# Patient Record
Sex: Female | Born: 1965
Health system: Southern US, Community
[De-identification: ages and names within clinical notes are randomized; demographics above are authoritative.]

## PROBLEM LIST (undated history)

## (undated) DIAGNOSIS — J45909 Unspecified asthma, uncomplicated: Secondary | ICD-10-CM

## (undated) DIAGNOSIS — I10 Essential (primary) hypertension: Secondary | ICD-10-CM

## (undated) DIAGNOSIS — E785 Hyperlipidemia, unspecified: Secondary | ICD-10-CM

## (undated) HISTORY — DX: Hyperlipidemia, unspecified: E78.5

---

## 2001-12-06 ENCOUNTER — Other Ambulatory Visit: Admission: RE | Admit: 2001-12-06 | Discharge: 2001-12-06 | Payer: Self-pay | Admitting: Family Medicine

## 2009-07-25 ENCOUNTER — Ambulatory Visit: Payer: Self-pay | Admitting: Family Medicine

## 2010-09-17 ENCOUNTER — Ambulatory Visit: Payer: Self-pay | Admitting: Family Medicine

## 2012-07-14 ENCOUNTER — Ambulatory Visit: Payer: Self-pay

## 2013-04-08 IMAGING — MG MM CAD DIAGNOSTIC MAMMO
1 series · 8 of 8 positions shown · non-contrast
Comparison: none

REASON FOR EXAM: LT BRST LUMP 6 OCLOCK AND YRLY
COMMENTS:

[R CC · right · 8 of 9 slices shown]
[im 1/9]
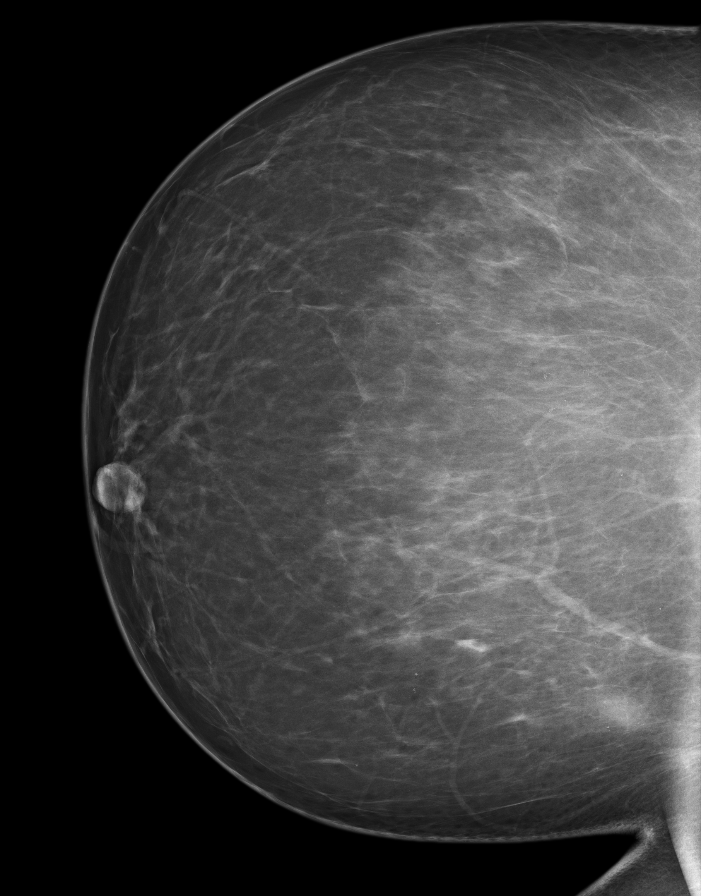
[im 2/9]
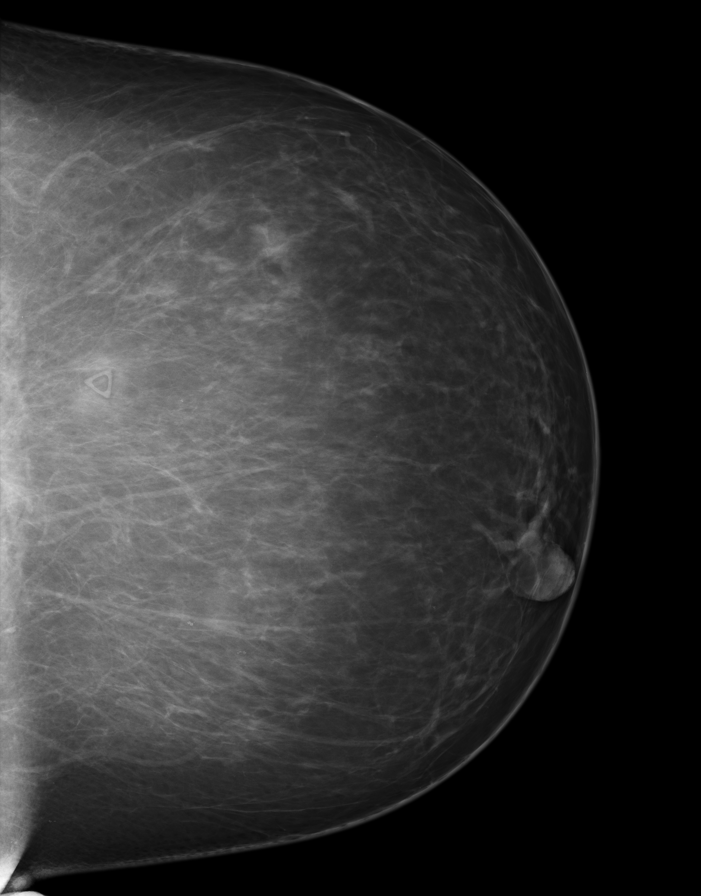
[im 3/9]
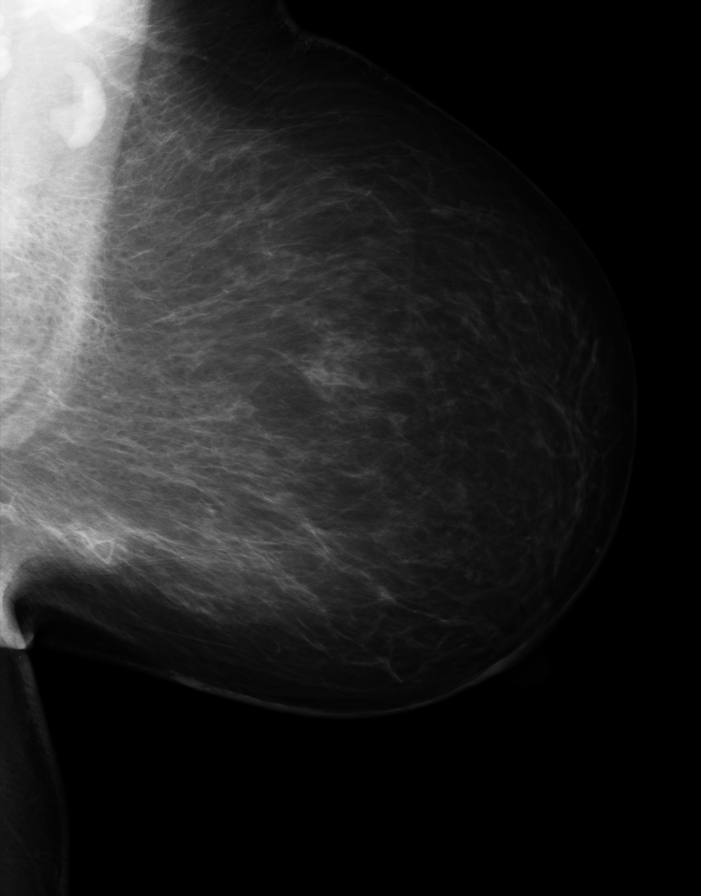
[im 4/9]
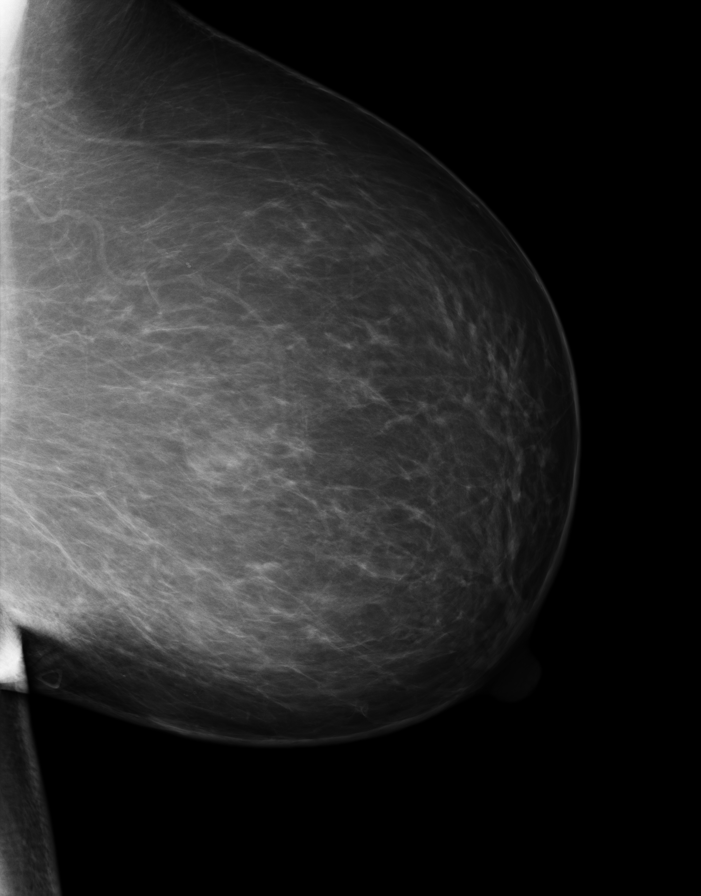
[im 5/9]
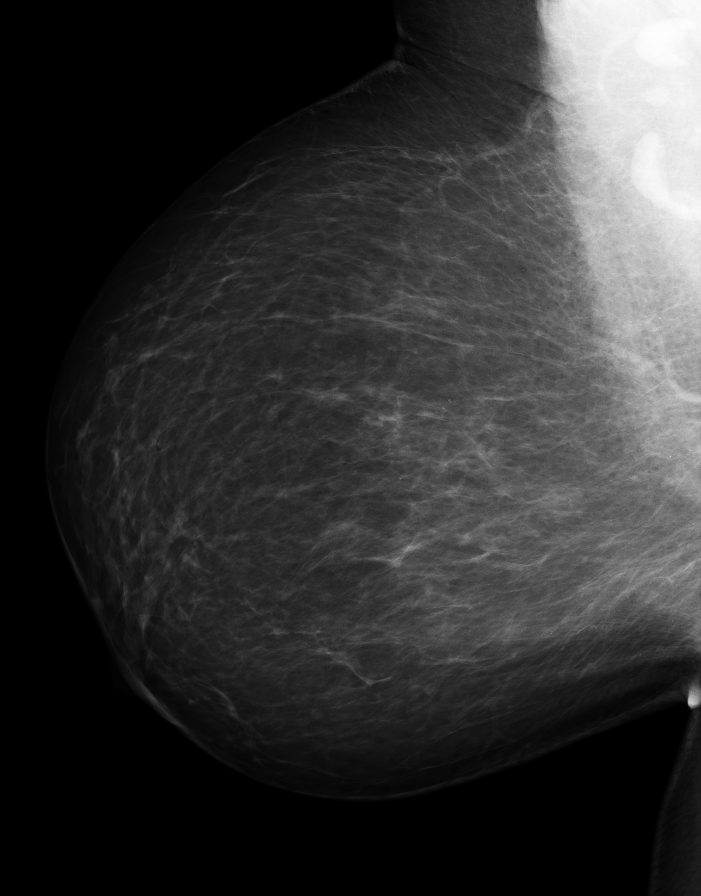
[im 6/9]
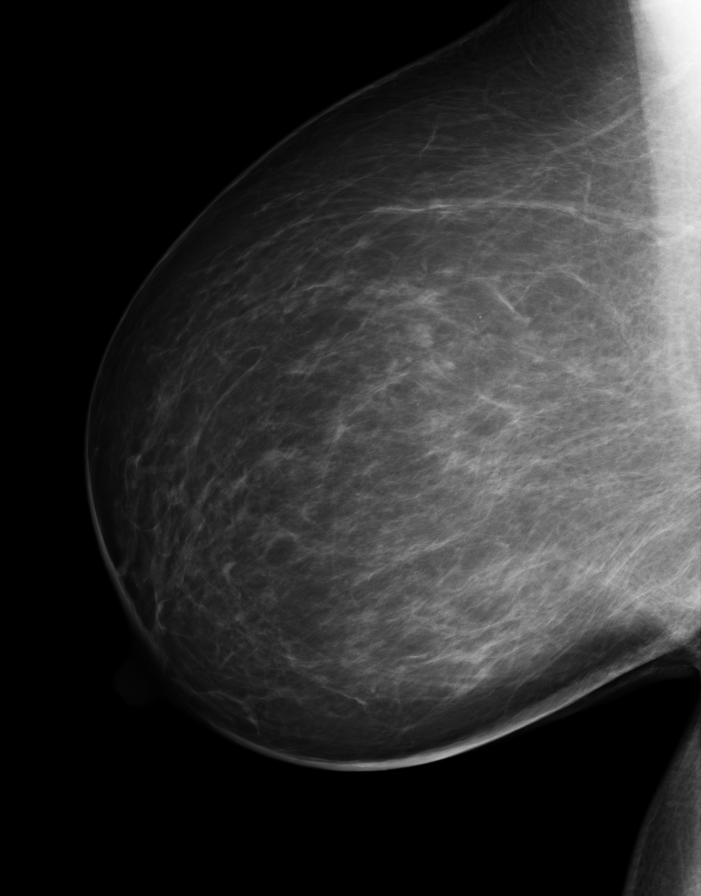
[im 7/9]
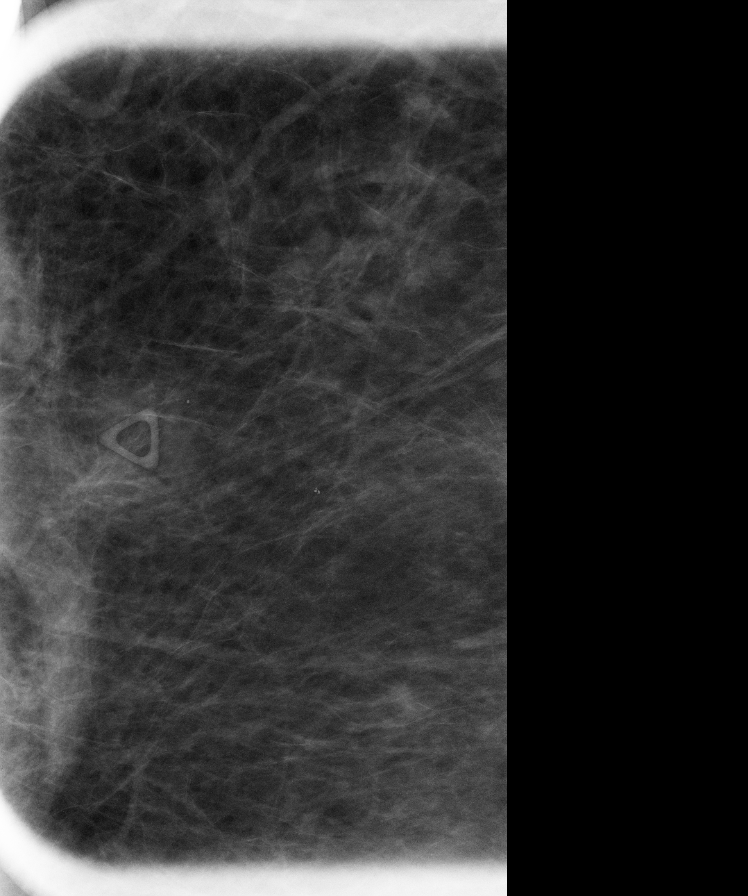
[im 9/9]
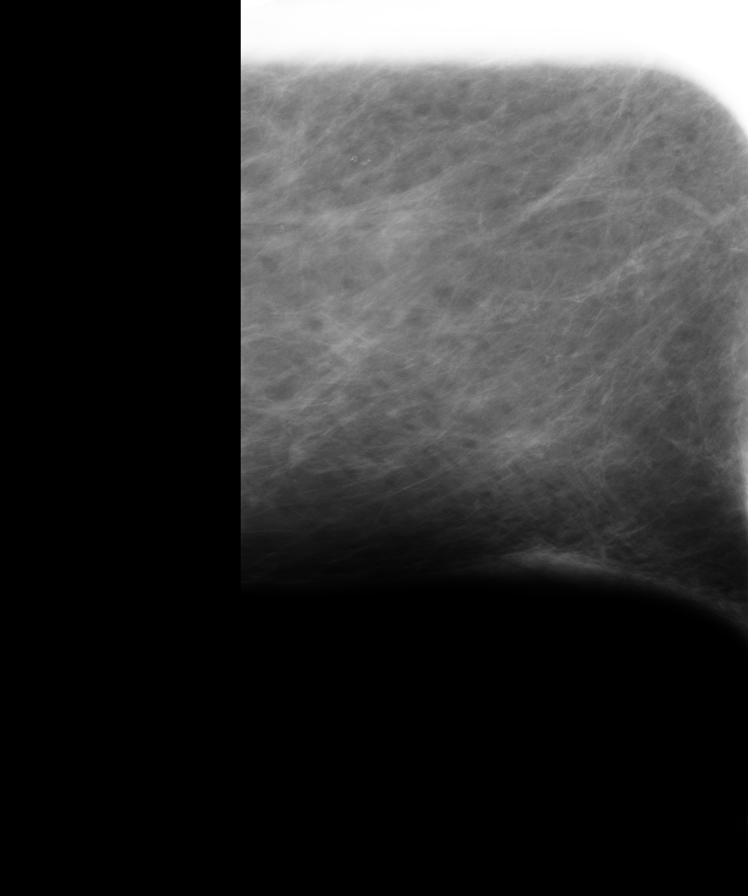

[8 of 8 positions shown; findings below may reference images not displayed]

PROCEDURE:     MAM - MAM DGTL DIAGNOSTIC MAMMO W/CAD  - July 14, 2012 [DATE]

RESULT:     The patient reports an area of palpable nodularity for the
preceding 2 weeks in the infero- lateral aspect of the left breast.
Comparison is made to a previous study September 17, 2010.

The breasts exhibit a scattered moderately dense parenchymal pattern similar
to that seen previously. There are benign-appearing lymph nodes in the
axillary regions. There is an area of increased density noted on the cc view
of the right breast medially that is not clearly evident on the MLO view. On
the left an area marked with a radiodense marker lies infero- laterally and
corresponds to the palpable finding. There are benign-appearing lymph nodes
in the axillary regions.

Spot compression views of the area of interest on the left reveals no cyst
discrete mass. Ultrasound performed today revealed an ovoid hypoechoic
nonshadowing structure in the immediate subcutaneous tissues measuring
approximately 2 cm in length by a 0.5 cm in thickness x 1.2 cm transversely.

True lateral views of both breasts do not reveal suspicious findings.
IMPRESSION: On the left in the area palpable abnormality there is a a
benign-appearing ovoid hypoechoic mass at ultrasound corresponding to the
density mammographically. The right breast does not reveal suspicious
findings.

BI-RADS 4a: There is an indeterminate hypoechoic mass at approximately the
[DATE] position of the left breast corresponding to the palpable finding. This
is likely benign but surgical consultation with an eye toward close clinical
followup or percutaneous biopsy is recommended. Continued annual
mammographic followup of the right breast is recommended.

A NEGATIVE MAMMOGRAM REPORT DOES NOT PRECLUDE BIOPSY OR OTHER EVALUATION OF
A CLINICALLY PALPABLE OR OTHERWISE SUSPICIOUS MASS OR LESION. BREAST CANCER
MAY NOT BE DETECTED BY MAMMOGRAPHY IN UP TO 10% OF CASES.

Dictation site:1

## 2013-06-29 ENCOUNTER — Other Ambulatory Visit: Payer: Self-pay | Admitting: Obstetrics and Gynecology

## 2013-06-29 ENCOUNTER — Encounter (HOSPITAL_COMMUNITY): Payer: Self-pay

## 2013-06-29 ENCOUNTER — Encounter (HOSPITAL_COMMUNITY)
Admission: RE | Admit: 2013-06-29 | Discharge: 2013-06-29 | Disposition: A | Discharge status: Home / Self Care | Payer: 59 | Source: Ambulatory Visit | Attending: Obstetrics and Gynecology | Admitting: Obstetrics and Gynecology

## 2013-06-29 DIAGNOSIS — Z01810 Encounter for preprocedural cardiovascular examination: Secondary | ICD-10-CM | POA: Insufficient documentation

## 2013-06-29 DIAGNOSIS — Z01812 Encounter for preprocedural laboratory examination: Secondary | ICD-10-CM | POA: Insufficient documentation

## 2013-06-29 DIAGNOSIS — Z01818 Encounter for other preprocedural examination: Secondary | ICD-10-CM | POA: Insufficient documentation

## 2013-06-29 HISTORY — DX: Unspecified asthma, uncomplicated: J45.909

## 2013-06-29 HISTORY — DX: Essential (primary) hypertension: I10

## 2013-06-29 LAB — COMPREHENSIVE METABOLIC PANEL
Alkaline Phosphatase: 70 U/L (ref 39–117)
BUN: 18 mg/dL (ref 6–23)
CO2: 31 mEq/L (ref 19–32)
Chloride: 95 mEq/L — ABNORMAL LOW (ref 96–112)
GFR calc Af Amer: 73 mL/min — ABNORMAL LOW (ref >90)
GFR calc non Af Amer: 63 mL/min — ABNORMAL LOW (ref >90)
Glucose, Bld: 93 mg/dL (ref 70–99)
Potassium: 4.3 mEq/L (ref 3.5–5.1)
Total Bilirubin: 0.2 mg/dL — ABNORMAL LOW (ref 0.3–1.2)
Total Protein: 6.9 g/dL (ref 6.0–8.3)

## 2013-06-29 LAB — CBC
HCT: 41.8 % (ref 36.0–46.0)
Hemoglobin: 13.9 g/dL (ref 12.0–15.0)
MCHC: 33.3 g/dL (ref 30.0–36.0)

## 2013-06-29 NOTE — Patient Instructions (Signed)
20 Colleen Gibbs  06/29/2013   Your procedure is scheduled on:  July 14, 2013   Enter through the Main Entrance of [facility] at 0900 AM.  Pick up the phone at the desk and dial [phone].   Call this number if you have problems the morning of surgery: [phone]   Remember:   Do not eat food:After Midnight.  Do not drink clear liquids: After Midnight.  Take these medicines the morning of surgery with A SIP OF WATER: Lisinopril and Metoprolol, bring inhaler day of surgery as well.   Do not wear jewelry, make-up or nail polish.  Do not wear lotions, powders, or perfumes. You may wear deodorant.  Do not shave 48 hours prior to surgery.  Do not bring valuables to the hospital.  [facility] is not   responsible for any belongings or valuables brought to the hospital.  Contacts, dentures or bridgework may not be worn into surgery.  Leave suitcase in the car. After surgery it may be brought to your room.  For patients admitted to the hospital, checkout time is 11:00 AM the day of              discharge.   Patients discharged the day of surgery will not be allowed to drive             home.  Name and phone number of your driver: Synetta Fail [phone]  Please read over the following fact sheets that you were given:   Preparing for surgery

## 2013-07-14 ENCOUNTER — Encounter (HOSPITAL_COMMUNITY): Payer: Self-pay | Admitting: Registered Nurse

## 2013-07-14 ENCOUNTER — Ambulatory Visit (HOSPITAL_COMMUNITY): Payer: 59 | Admitting: Registered Nurse

## 2013-07-14 ENCOUNTER — Ambulatory Visit (HOSPITAL_COMMUNITY)
Admission: RE | Admit: 2013-07-14 | Discharge: 2013-07-14 | Disposition: A | Discharge status: Home / Self Care | Payer: 59 | Source: Ambulatory Visit | Attending: Obstetrics and Gynecology | Admitting: Obstetrics and Gynecology

## 2013-07-14 ENCOUNTER — Encounter (HOSPITAL_COMMUNITY)
Admission: RE | Disposition: A | Discharge status: Home / Self Care | Payer: Self-pay | Source: Ambulatory Visit | Attending: Obstetrics and Gynecology

## 2013-07-14 DIAGNOSIS — Z302 Encounter for sterilization: Secondary | ICD-10-CM | POA: Insufficient documentation

## 2013-07-14 HISTORY — PX: LAPAROSCOPIC TUBAL LIGATION: SHX1937

## 2013-07-14 LAB — PREGNANCY, URINE: Preg Test, Ur: NEGATIVE

## 2013-07-14 SURGERY — LIGATION, FALLOPIAN TUBE, LAPAROSCOPIC
Anesthesia: General | Site: Abdomen | Laterality: Bilateral | Wound class: Clean

## 2013-07-14 MED ORDER — ONDANSETRON HCL 4 MG/2ML IJ SOLN: 4.0000 mg | Freq: Once | INTRAMUSCULAR | Status: DC | PRN

## 2013-07-14 MED ORDER — LIDOCAINE HCL (CARDIAC) 20 MG/ML IV SOLN
INTRAVENOUS | Status: AC
  Filled 2013-07-14: qty 5

## 2013-07-14 MED ORDER — ROCURONIUM BROMIDE 50 MG/5ML IV SOLN
INTRAVENOUS | Status: AC
  Filled 2013-07-14: qty 1

## 2013-07-14 MED ORDER — LIDOCAINE HCL (CARDIAC) 20 MG/ML IV SOLN
INTRAVENOUS | Status: DC | PRN
  Administered 2013-07-14: 100 mg via INTRAVENOUS

## 2013-07-14 MED ORDER — IBUPROFEN 800 MG PO TABS: 800.0000 mg | ORAL_TABLET | Freq: Three times a day (TID) | ORAL | Status: DC | PRN

## 2013-07-14 MED ORDER — ONDANSETRON HCL 4 MG/2ML IJ SOLN
INTRAMUSCULAR | Status: AC
  Filled 2013-07-14: qty 2

## 2013-07-14 MED ORDER — GLYCOPYRROLATE 0.2 MG/ML IJ SOLN
INTRAMUSCULAR | Status: AC
  Filled 2013-07-14: qty 3

## 2013-07-14 MED ORDER — NEOSTIGMINE METHYLSULFATE 1 MG/ML IJ SOLN
INTRAMUSCULAR | Status: DC | PRN
  Administered 2013-07-14: 4 mg via INTRAVENOUS

## 2013-07-14 MED ORDER — SODIUM CHLORIDE 0.9 % IJ SOLN
INTRAMUSCULAR | Status: DC | PRN
  Administered 2013-07-14: 10 mL

## 2013-07-14 MED ORDER — FENTANYL CITRATE 0.05 MG/ML IJ SOLN
INTRAMUSCULAR | Status: AC
  Filled 2013-07-14: qty 5

## 2013-07-14 MED ORDER — MIDAZOLAM HCL 2 MG/2ML IJ SOLN
INTRAMUSCULAR | Status: AC
  Filled 2013-07-14: qty 2

## 2013-07-14 MED ORDER — DEXAMETHASONE SODIUM PHOSPHATE 10 MG/ML IJ SOLN
INTRAMUSCULAR | Status: AC
  Filled 2013-07-14: qty 1

## 2013-07-14 MED ORDER — PROPOFOL 10 MG/ML IV EMUL
INTRAVENOUS | Status: AC
  Filled 2013-07-14: qty 20

## 2013-07-14 MED ORDER — PROPOFOL 10 MG/ML IV BOLUS
INTRAVENOUS | Status: DC | PRN
  Administered 2013-07-14: 200 mg via INTRAVENOUS

## 2013-07-14 MED ORDER — EPHEDRINE SULFATE 50 MG/ML IJ SOLN
INTRAMUSCULAR | Status: DC | PRN
  Administered 2013-07-14 (×2): 15 mg via INTRAVENOUS

## 2013-07-14 MED ORDER — ONDANSETRON HCL 4 MG/2ML IJ SOLN
INTRAMUSCULAR | Status: DC | PRN
  Administered 2013-07-14: 4 mg via INTRAVENOUS

## 2013-07-14 MED ORDER — MEPERIDINE HCL 25 MG/ML IJ SOLN: 6.2500 mg | INTRAMUSCULAR | Status: DC | PRN

## 2013-07-14 MED ORDER — ROCURONIUM BROMIDE 100 MG/10ML IV SOLN
INTRAVENOUS | Status: DC | PRN
  Administered 2013-07-14: 10 mg via INTRAVENOUS
  Administered 2013-07-14: 40 mg via INTRAVENOUS

## 2013-07-14 MED ORDER — MIDAZOLAM HCL 5 MG/5ML IJ SOLN
INTRAMUSCULAR | Status: DC | PRN
  Administered 2013-07-14: 2 mg via INTRAVENOUS

## 2013-07-14 MED ORDER — GLYCOPYRROLATE 0.2 MG/ML IJ SOLN
INTRAMUSCULAR | Status: DC | PRN
  Administered 2013-07-14: 0.6 mg via INTRAVENOUS

## 2013-07-14 MED ORDER — LACTATED RINGERS IV SOLN
INTRAVENOUS | Status: DC
  Administered 2013-07-14 (×2): via INTRAVENOUS

## 2013-07-14 MED ORDER — SUCCINYLCHOLINE CHLORIDE 20 MG/ML IJ SOLN
INTRAMUSCULAR | Status: DC | PRN
  Administered 2013-07-14: 120 mg via INTRAVENOUS

## 2013-07-14 MED ORDER — NEOSTIGMINE METHYLSULFATE 1 MG/ML IJ SOLN
INTRAMUSCULAR | Status: AC
  Filled 2013-07-14: qty 1

## 2013-07-14 MED ORDER — FENTANYL CITRATE 0.05 MG/ML IJ SOLN: 25.0000 ug | INTRAMUSCULAR | Status: DC | PRN

## 2013-07-14 MED ORDER — EPHEDRINE 5 MG/ML INJ
INTRAVENOUS | Status: AC
  Filled 2013-07-14: qty 10

## 2013-07-14 MED ORDER — KETOROLAC TROMETHAMINE 30 MG/ML IJ SOLN: 15.0000 mg | Freq: Once | INTRAMUSCULAR | Status: DC | PRN

## 2013-07-14 MED ORDER — BUPIVACAINE HCL (PF) 0.25 % IJ SOLN
INTRAMUSCULAR | Status: DC | PRN
  Administered 2013-07-14: 7 mL

## 2013-07-14 MED ORDER — DEXAMETHASONE SODIUM PHOSPHATE 10 MG/ML IJ SOLN
INTRAMUSCULAR | Status: DC | PRN
  Administered 2013-07-14: 10 mg via INTRAVENOUS

## 2013-07-14 MED ORDER — FENTANYL CITRATE 0.05 MG/ML IJ SOLN
INTRAMUSCULAR | Status: DC | PRN
  Administered 2013-07-14: 150 ug via INTRAVENOUS

## 2013-07-14 MED ORDER — BUPIVACAINE HCL (PF) 0.25 % IJ SOLN
INTRAMUSCULAR | Status: AC
  Filled 2013-07-14: qty 30

## 2013-07-14 MED ORDER — SUCCINYLCHOLINE CHLORIDE 20 MG/ML IJ SOLN
INTRAMUSCULAR | Status: AC
  Filled 2013-07-14: qty 10

## 2013-07-14 SURGICAL SUPPLY — 25 items
CATH ROBINSON RED A/P 16FR (CATHETERS) ×2 IMPLANT
CLOTH BEACON ORANGE TIMEOUT ST (SAFETY) ×2 IMPLANT
DERMABOND ADVANCED (GAUZE/BANDAGES/DRESSINGS) ×1
DERMABOND ADVANCED .7 DNX12 (GAUZE/BANDAGES/DRESSINGS) ×1 IMPLANT
DILATOR CANAL MILEX (MISCELLANEOUS) ×2 IMPLANT
FORCEPS CUTTING 33CM 5MM (CUTTING FORCEPS) ×2 IMPLANT
FORCEPS CUTTING 45CM 5MM (CUTTING FORCEPS) ×2 IMPLANT
GLOVE BIO SURGEON STRL SZ 6.5 (GLOVE) ×4 IMPLANT
GLOVE BIOGEL PI IND STRL 7.0 (GLOVE) ×3 IMPLANT
GLOVE BIOGEL PI INDICATOR 7.0 (GLOVE) ×3
GOWN PREVENTION PLUS LG XLONG (DISPOSABLE) ×4 IMPLANT
NEEDLE INSUFFLATION 120MM (ENDOMECHANICALS) ×2 IMPLANT
NEEDLE INSUFFLATION 150MM (ENDOMECHANICALS) ×2 IMPLANT
PACK LAPAROSCOPY BASIN (CUSTOM PROCEDURE TRAY) ×2 IMPLANT
SUT VICRYL 0 UR6 27IN ABS (SUTURE) ×2 IMPLANT
SUT VICRYL 4-0 PS2 18IN ABS (SUTURE) ×2 IMPLANT
SUT VICRYL RAPIDE 4/0 PS 2 (SUTURE) ×2 IMPLANT
TOWEL OR 17X24 6PK STRL BLUE (TOWEL DISPOSABLE) ×4 IMPLANT
TROCAR 12M 150ML BLUNT (TROCAR) ×2 IMPLANT
TROCAR OPTI TIP 5M 100M (ENDOMECHANICALS) ×2 IMPLANT
TROCAR XCEL DIL TIP R 11M (ENDOMECHANICALS) ×2 IMPLANT
TROCAR XCEL NON-BLD 5MMX100MML (ENDOMECHANICALS) ×2 IMPLANT
TROCAR XCEL OPT SLVE 5M 100M (ENDOMECHANICALS) ×2 IMPLANT
TUBING INSUFFLATION W/FILTER (TUBING) ×2 IMPLANT
WATER STERILE IRR 1000ML POUR (IV SOLUTION) ×2 IMPLANT

## 2013-07-14 NOTE — Brief Op Note (Signed)
07/14/2013  12:18 PM  PATIENT:  Colleen Gibbs  47 y.o. female  PRE-OPERATIVE DIAGNOSIS:  Desires Sterilization    POST-OPERATIVE DIAGNOSIS:  Desires Sterilization , morbid obesity  PROCEDURE:  Laparoscopic bilateral salpingectomy  SURGEON:  Surgeon(s) and Role:    * Machi Whittaker A Katira Dumais, MD - Primary  PHYSICIAN ASSISTANT:   ASSISTANTS: none   ANESTHESIA:   general Findings: nl ovaries, nl tubes, uterus w/ small fibroid EBL:  Total I/O In: 1000 [I.V.:1000] Out: 20 [Urine:20]  BLOOD ADMINISTERED:none  DRAINS: none   LOCAL MEDICATIONS USED:  MARCAINE     SPECIMEN:  Source of Specimen:  bilateral tubes  DISPOSITION OF SPECIMEN:  PATHOLOGY  COUNTS:  YES  TOURNIQUET:  * No tourniquets in log *  DICTATION: .Other Dictation: Dictation Number [id number]  PLAN OF CARE: Discharge to home after PACU  PATIENT DISPOSITION:  PACU - hemodynamically stable.   Delay start of Pharmacological VTE agent (>24hrs) due to surgical blood loss or risk of bleeding: no

## 2013-07-14 NOTE — Anesthesia Postprocedure Evaluation (Signed)
  Anesthesia Post-op Note  Patient: Colleen Gibbs  Procedure(s) Performed: Procedure(s): LAPAROSCOPIC TUBAL LIGATION (Bilateral)  Patient Location: PACU  Anesthesia Type:General  Level of Consciousness: awake, alert  and oriented  Airway and Oxygen Therapy: Patient Spontanous Breathing  Post-op Pain: none  Post-op Assessment: Post-op Vital signs reviewed, Patient's Cardiovascular Status Stable, Respiratory Function Stable, Patent Airway, No signs of Nausea or vomiting and Pain level controlled  Post-op Vital Signs: Reviewed and stable  Complications: No apparent anesthesia complications

## 2013-07-14 NOTE — Transfer of Care (Signed)
Immediate Anesthesia Transfer of Care Note  Patient: Colleen Gibbs  Procedure(s) Performed: Procedure(s): LAPAROSCOPIC TUBAL LIGATION (Bilateral)  Patient Location: PACU  Anesthesia Type:General  Level of Consciousness: awake, alert , oriented and patient cooperative  Airway & Oxygen Therapy: Patient Spontanous Breathing and Patient connected to nasal cannula oxygen  Post-op Assessment: Report given to PACU RN, Post -op Vital signs reviewed and stable and Patient moving all extremities X 4  Post vital signs: Reviewed and stable  Complications: No apparent anesthesia complications

## 2013-07-14 NOTE — Preoperative (Signed)
Beta Blockers   Reason not to administer Beta Blockers:Not Applicable 

## 2013-07-14 NOTE — Anesthesia Preprocedure Evaluation (Signed)
Anesthesia Evaluation  Patient identified by MRN, date of birth, ID band Patient awake    Reviewed: Allergy & Precautions, H&P , NPO status , Patient's Chart, lab work & pertinent test results, reviewed documented beta blocker date and time   Airway Mallampati: I TM Distance: >3 FB Neck ROM: full    Dental no notable dental hx. (+) Teeth Intact   Pulmonary  Will use her inhaler prior to OR   Pulmonary exam normal       Cardiovascular hypertension, Pt. on medications and Pt. on home beta blockers negative cardio ROS      Neuro/Psych negative neurological ROS  negative psych ROS   GI/Hepatic negative GI ROS, Neg liver ROS,   Endo/Other  negative endocrine ROSMorbid obesity  Renal/GU negative Renal ROS  negative genitourinary   Musculoskeletal negative musculoskeletal ROS (+)   Abdominal (+) + obese,   Peds  Hematology negative hematology ROS (+)   Anesthesia Other Findings   Reproductive/Obstetrics negative OB ROS                           Anesthesia Physical Anesthesia Plan  ASA: III  Anesthesia Plan: General   Post-op Pain Management:    Induction: Intravenous  Airway Management Planned: Oral ETT  Additional Equipment:   Intra-op Plan:   Post-operative Plan: Extubation in OR  Informed Consent: I have reviewed the patients History and Physical, chart, labs and discussed the procedure including the risks, benefits and alternatives for the proposed anesthesia with the patient or authorized representative who has indicated his/her understanding and acceptance.   Dental Advisory Given  Plan Discussed with: CRNA and Surgeon  Anesthesia Plan Comments:         Anesthesia Quick Evaluation

## 2013-07-15 ENCOUNTER — Encounter (HOSPITAL_COMMUNITY): Payer: Self-pay | Admitting: Obstetrics and Gynecology

## 2013-07-15 NOTE — Op Note (Signed)
NAME:  Colleen Gibbs, Colleen Gibbs NO.:  [id number]  MEDICAL RECORD NO.:  [id number]  LOCATION:  [facility]                          FACILITY:  [facility]  PHYSICIAN:  Maxie Better, M.D.DATE OF BIRTH:  10/18/1965  DATE OF PROCEDURE:  07/14/2013 DATE OF DISCHARGE:  07/14/2013                              OPERATIVE REPORT   PREOPERATIVE DIAGNOSIS:  Desires sterilization.  PROCEDURE:  Laparoscopic bilateral salpingectomy.  POSTOPERATIVE DIAGNOSIS:  Desires sterilization, morbid obesity.  ANESTHESIA:  General.  SURGEON:  Maxie Better, MD.  ASSISTANT:  None.  DESCRIPTION OF PROCEDURE:  Under adequate general anesthesia, the patient was placed in dorsal lithotomy position.  She was sterilely prepped and draped in usual fashion.  Bladder was catheterized.  Mild amount of urine.  Examination under anesthesia revealed anteverted uterus.  The exam, however, was limited by the patient's body habitus. Bivalve speculum was placed in vagina.  Single-tooth tenaculum was placed on the anterior lip of the cervix.  Os Finders was used to dilate the cervix.  An Acorn cannula was introduced into the cervical os and attached tenaculum for manipulation of the uterus.  Attention the bivalve speculum was removed.  Then attention was then turned to the abdomen.  An 0.25% Marcaine was injected in the infraumbilical area.  An infraumbilical vertical incision was made.  Veress needle was introduced and tested several times with no return on fluid.  At that point, the vesi point with a straight scope was then used to enter the abdominal Cavity under direct visualization.  Once this was done, the operative scope was inserted and the abdomen was insufflated.  In the left lower quadrant, a 5-mm port site was placed under direct visualization.  There was 1 place on the right lower quadrant, which was limited due to the patient's anterior abdominal wall fat.  Nonetheless, the port was finally placed.   Inspection notable for uterus with a small anterior fibroid.  Both ovaries appeared normal.  The liver edge appeared normal.  Both fallopian tubes were brought up into the field and was grasped the underlying mesosalpinx was then serially clamped, cauterized, and then cut using the gyrus and both tubes were then removed.  Both tubes were then removed through the umbilical port site.  Once this was done, the pelvis was inspected.  The port sites was inspected.  The lower ports were removed under direct visualization taking care not to bring up any underlying structures.  Good hemostasis was noted. The umbilical site was also removed and the incisions were then closed with 4-0 Vicryl sutures and the instruments in the vagina were removed.  SPECIMENS:  Fallopian tubes x2 sent to Pathology.  ESTIMATED BLOOD LOSS:  10 mL.  COMPLICATIONS:  None.  The patient tolerated procedure well, was transferred to recovery in stable condition.     Maxie Better, M.D.     Lafferty/MEDQ  D:  07/14/2013  T:  07/15/2013  Job:  [id number]

## 2013-11-02 ENCOUNTER — Ambulatory Visit: Payer: Self-pay | Admitting: Family Medicine

## 2014-07-28 IMAGING — CR DG HIP COMPLETE 2+V*L*
1 series · 3 of 3 positions shown · non-contrast
Comparison: None.

CLINICAL DATA: Left hip pain post fall a week ago

EXAM:
LEFT HIP - COMPLETE 2+ VIEW

[Series 2: t pelvis ap · 0.14mm/px · 3 of 3 slices shown]
[im 1/3]
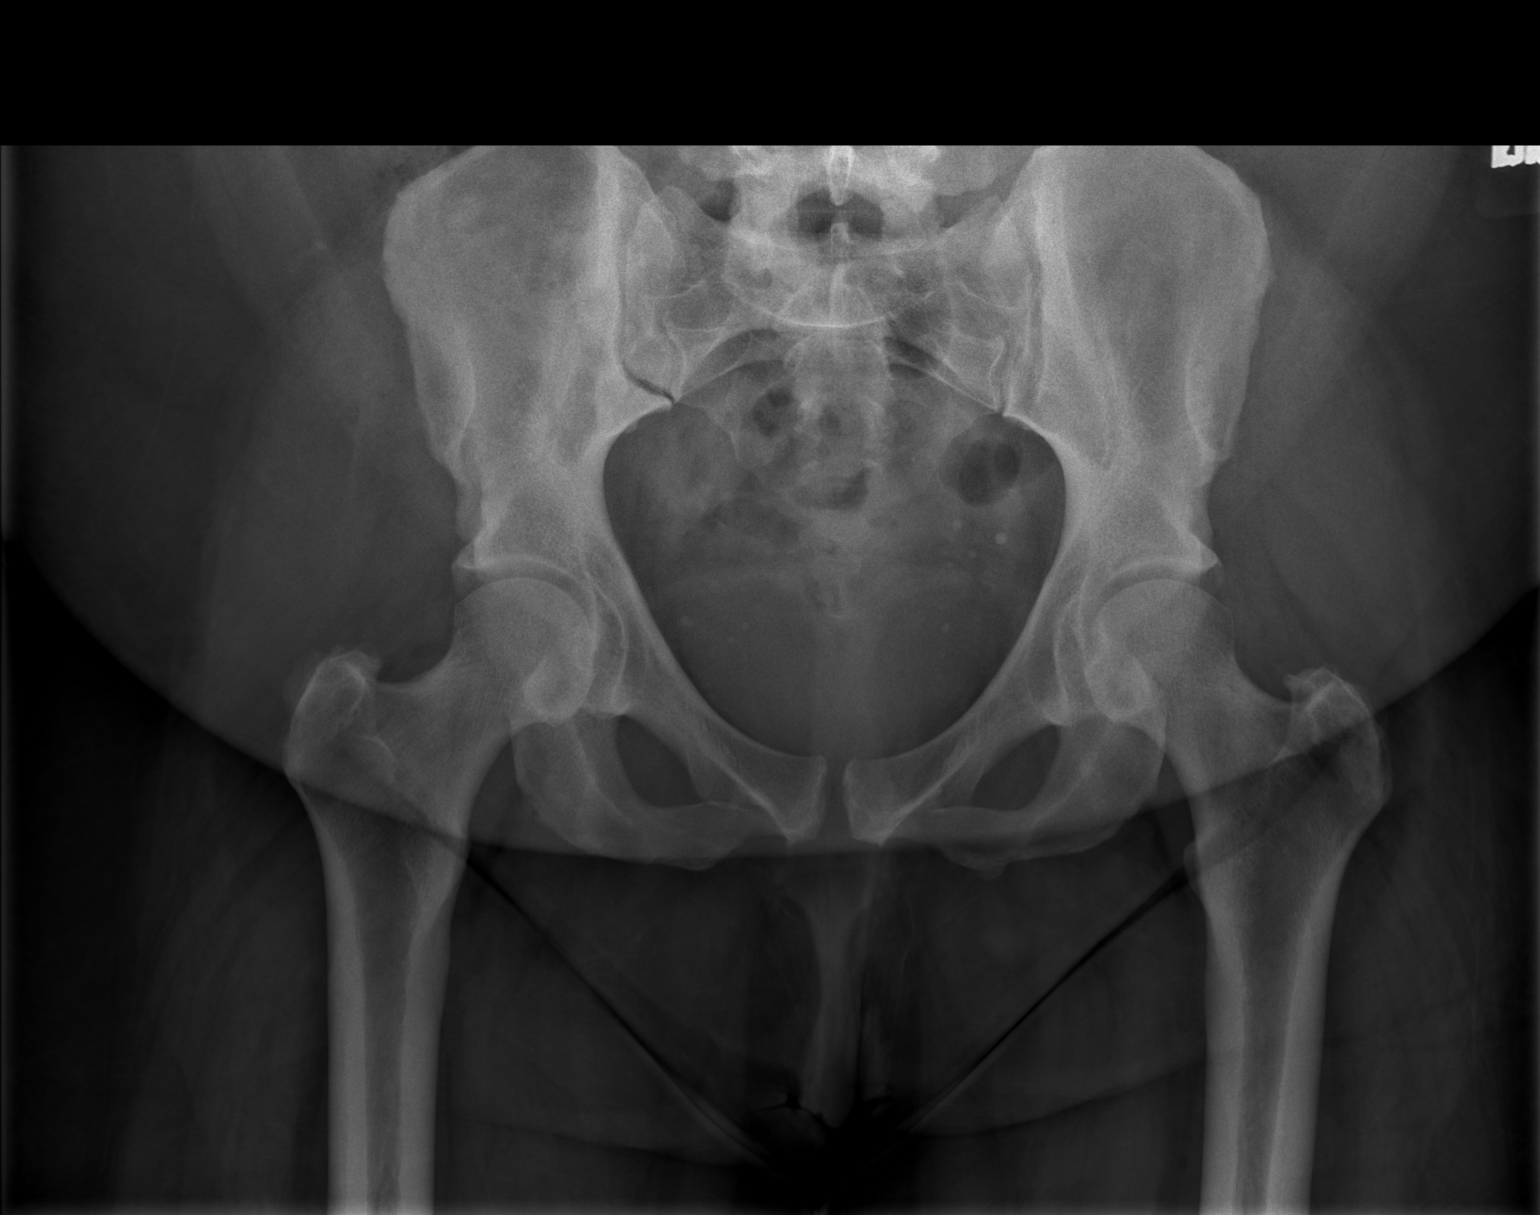
[im 2/3]
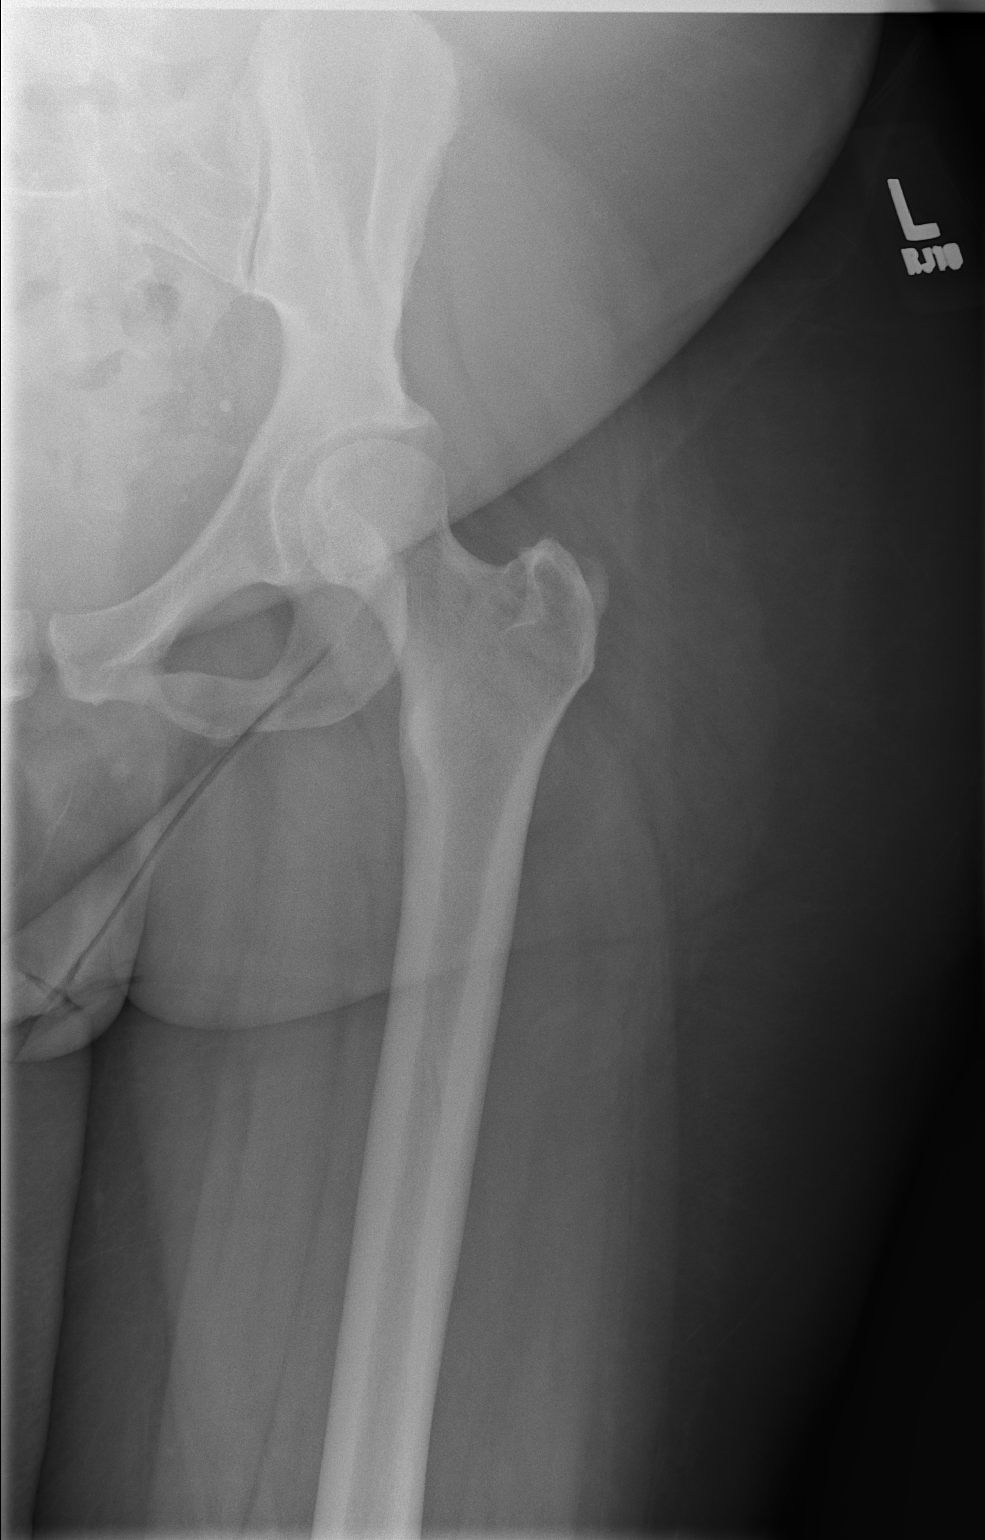
[im 3/3]
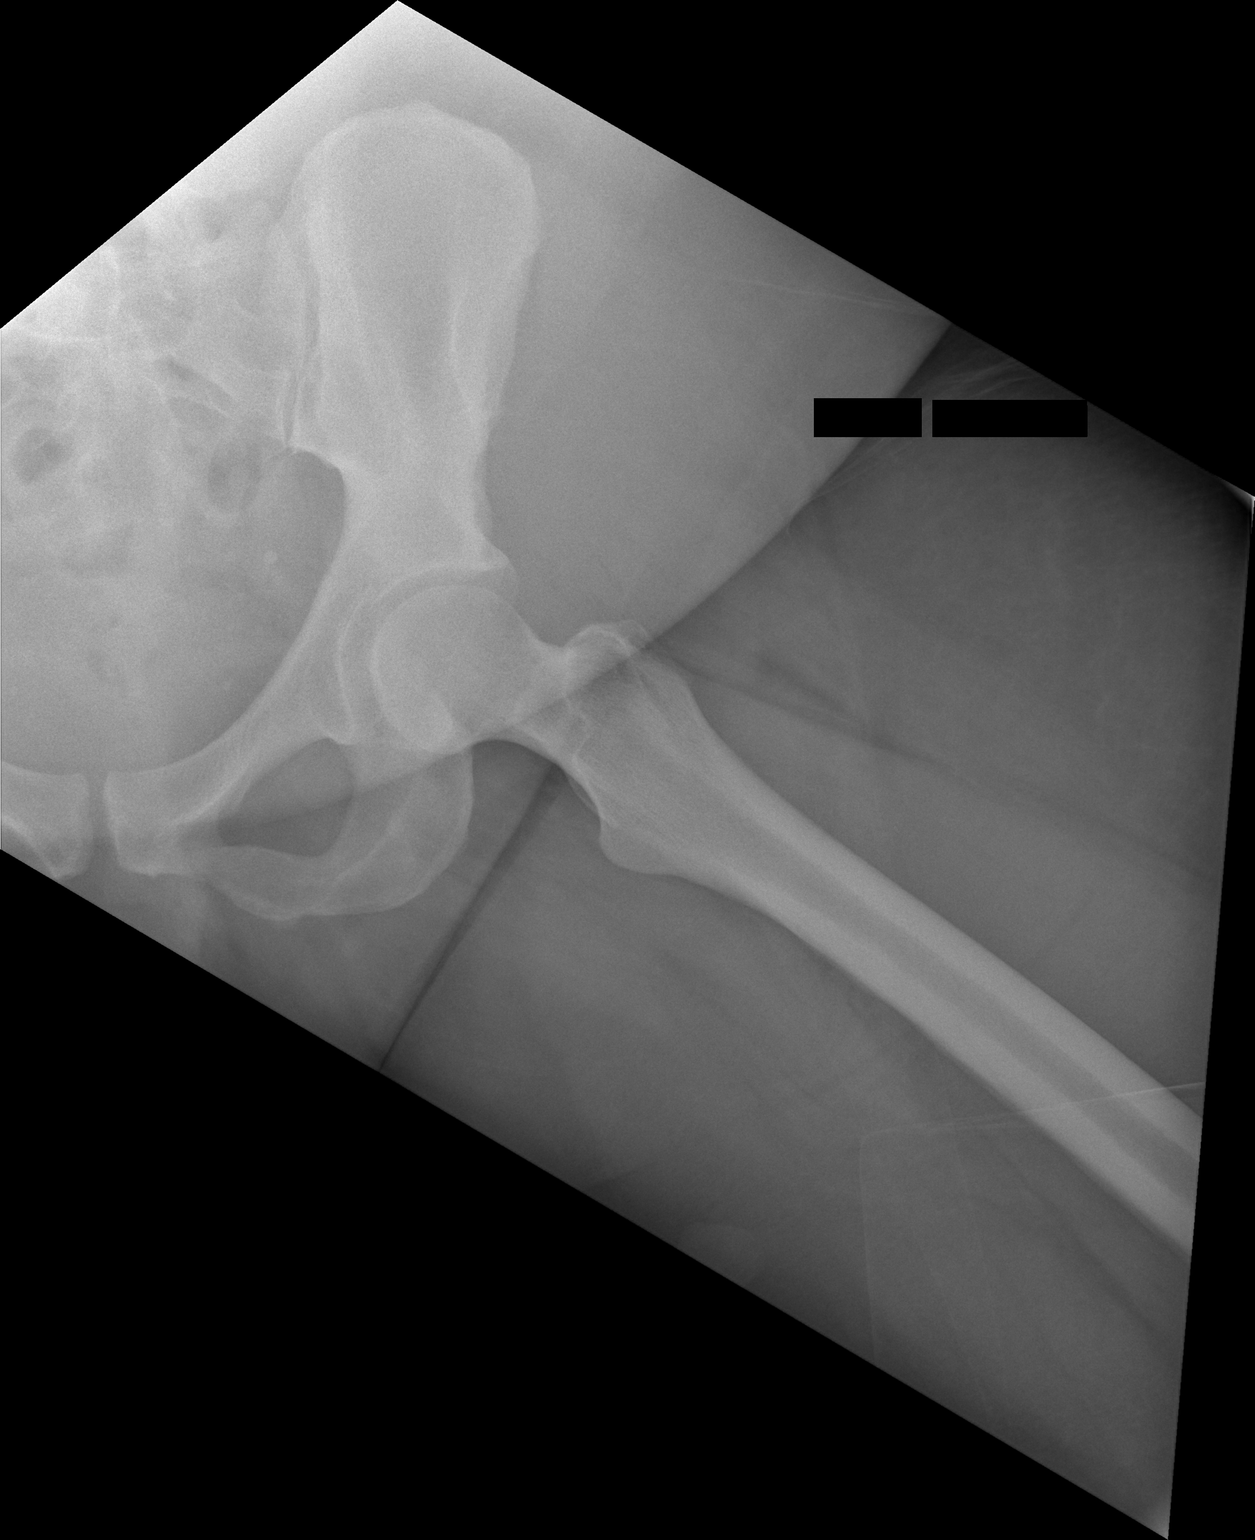

[3 of 3 positions shown; findings below may reference images not displayed]

FINDINGS: Three views of the left hip submitted. No acute fracture or
subluxation. No radiopaque foreign body.
IMPRESSION: Negative.

## 2015-04-13 ENCOUNTER — Other Ambulatory Visit
Admission: RE | Admit: 2015-04-13 | Discharge: 2015-04-13 | Disposition: A | Discharge status: Home / Self Care | Payer: 59 | Source: Ambulatory Visit | Attending: Cardiology | Admitting: Cardiology

## 2015-04-13 DIAGNOSIS — I10 Essential (primary) hypertension: Secondary | ICD-10-CM | POA: Insufficient documentation

## 2015-04-13 DIAGNOSIS — R739 Hyperglycemia, unspecified: Secondary | ICD-10-CM | POA: Diagnosis present

## 2015-04-13 DIAGNOSIS — R809 Proteinuria, unspecified: Secondary | ICD-10-CM | POA: Diagnosis present

## 2015-04-13 LAB — LIPID PANEL
CHOLESTEROL: 233 mg/dL — AB (ref 0–200)
HDL: 63 mg/dL (ref >40)
LDL Cholesterol: 143 mg/dL — ABNORMAL HIGH (ref 0–99)
TRIGLYCERIDES: 136 mg/dL (ref <150)
Total CHOL/HDL Ratio: 3.7 RATIO
VLDL: 27 mg/dL (ref 0–40)

## 2015-04-13 LAB — HEMOGLOBIN A1C: HEMOGLOBIN A1C: 5.8 % (ref 4.0–6.0)

## 2015-04-13 LAB — URINALYSIS COMPLETE WITH MICROSCOPIC (ARMC ONLY)
Bilirubin Urine: NEGATIVE
Glucose, UA: NEGATIVE mg/dL
Ketones, ur: NEGATIVE mg/dL
NITRITE: NEGATIVE
Protein, ur: 30 mg/dL — AB
SPECIFIC GRAVITY, URINE: 1.017 (ref 1.005–1.030)
pH: 5 (ref 5.0–8.0)

## 2015-04-13 LAB — COMPREHENSIVE METABOLIC PANEL
ALT: 20 U/L (ref 14–54)
AST: 20 U/L (ref 15–41)
Albumin: 4.1 g/dL (ref 3.5–5.0)
Alkaline Phosphatase: 69 U/L (ref 38–126)
Anion gap: 10 (ref 5–15)
BUN: 18 mg/dL (ref 6–20)
CALCIUM: 9.2 mg/dL (ref 8.9–10.3)
CO2: 27 mmol/L (ref 22–32)
CREATININE: 1.01 mg/dL — AB (ref 0.44–1.00)
Chloride: 101 mmol/L (ref 101–111)
GLUCOSE: 95 mg/dL (ref 65–99)
Potassium: 3.6 mmol/L (ref 3.5–5.1)
Sodium: 138 mmol/L (ref 135–145)
TOTAL PROTEIN: 8 g/dL (ref 6.5–8.1)
Total Bilirubin: 0.4 mg/dL (ref 0.3–1.2)

## 2015-04-13 LAB — CBC
HCT: 39 % (ref 35.0–47.0)
Hemoglobin: 12.7 g/dL (ref 12.0–16.0)
MCH: 28 pg (ref 26.0–34.0)
MCHC: 32.6 g/dL (ref 32.0–36.0)
MCV: 85.8 fL (ref 80.0–100.0)
Platelets: 305 10*3/uL (ref 150–440)
RBC: 4.54 MIL/uL (ref 3.80–5.20)
RDW: 17.1 % — AB (ref 11.5–14.5)
WBC: 9.4 10*3/uL (ref 3.6–11.0)

## 2015-04-14 LAB — MICROALBUMIN, URINE: Microalb, Ur: 30.3 ug/mL — ABNORMAL HIGH

## 2015-05-02 ENCOUNTER — Ambulatory Visit (INDEPENDENT_AMBULATORY_CARE_PROVIDER_SITE_OTHER): Payer: 59 | Admitting: Obstetrics and Gynecology

## 2015-05-02 ENCOUNTER — Encounter: Payer: Self-pay | Admitting: Obstetrics and Gynecology

## 2015-05-02 VITALS — BP 139/81 | HR 91 | Ht 59.0 in | Wt 263.0 lb

## 2015-05-02 DIAGNOSIS — Z1239 Encounter for other screening for malignant neoplasm of breast: Secondary | ICD-10-CM

## 2015-05-02 DIAGNOSIS — Z01419 Encounter for gynecological examination (general) (routine) without abnormal findings: Secondary | ICD-10-CM | POA: Diagnosis not present

## 2015-05-03 NOTE — Progress Notes (Signed)
Subjective:    Colleen Gibbs is a 49 y.o. female who presents for an annual exam. The patient has complaints of vaginal discharge last week, that has since resolved. The patient is sexually active in a monogamous opposite sex relationship. GYN screening history: last pap: approximate date 2012 and was normal and last mammogram: patient does not recall when last mammogram was done however notes it has been more than 3 years. The patient wears seatbelts: yes. The patient participates in regular exercise: no. Has the patient ever been transfused or tattooed?: no. The patient reports that there is not domestic violence in her life.   Menstrual History: OB History    Gravida Para Term Preterm AB TAB SAB Ectopic Multiple Living   0 0 0 0 0 0 0 0 0 0       Menarche age: 81 Patient's last menstrual period was 04/23/2015.  Reports regular menstrual cycles.  Cycles last 4-5 days, medium flow, no dysmenorrhea.   Denies h/o STIs.  Reports remote h/o 1 abnormal pap smear in the past, did not require treatment.    Past Medical History  Diagnosis Date  . Hypertension   . Asthma     just uses inhaler when needed  . Hyperlipemia     Past Surgical History  Procedure Laterality Date  . Laparoscopic tubal ligation Bilateral 07/14/2013    Procedure: LAPAROSCOPIC TUBAL LIGATION;  Surgeon: Marvene Staff, MD;  Location: [facility];  Service: Gynecology;  Laterality: Bilateral;    Family History  Problem Relation Age of Onset  . Hypertension Mother   . Gout Mother   . Heart attack Mother   . Heart attack Father   . Gout Father   . Hypertension Father   . Diabetes Sister    History   Social History  . Marital Status: Single    Spouse Name: N/A  . Number of Children: N/A  . Years of Education: N/A   Occupational History  . Not on file.   Social History Main Topics  . Smoking status: Never Smoker   . Smokeless tobacco: Not on file  . Alcohol Use: No  . Drug Use: No  . Sexual Activity:  Yes    Birth Control/ Protection: Surgical   Other Topics Concern  . Not on file   Social History Narrative    Outpatient Encounter Prescriptions as of 05/02/2015  Medication Sig  . amLODipine (NORVASC) 5 MG tablet Take 5 mg by mouth daily.  Marland Kitchen aspirin 81 MG tablet Take 81 mg by mouth daily.  Marland Kitchen lisinopril-hydrochlorothiazide (PRINZIDE,ZESTORETIC) 20-12.5 MG per tablet Take 1 tablet by mouth 2 (two) times daily.  . simvastatin (ZOCOR) 40 MG tablet Take 40 mg by mouth at bedtime.    No Known Allergies   Review of Systems Constitutional: negative for chills, fatigue, fevers and sweats Eyes: negative for irritation, redness and visual disturbance Ears, nose, mouth, throat, and face: negative for hearing loss, nasal congestion, snoring and tinnitus Respiratory: negative for asthma, cough, sputum Cardiovascular: negative for chest pain, dyspnea, exertional chest pressure/discomfort, irregular heart beat, palpitations and syncope Gastrointestinal: negative for abdominal pain, change in bowel habits, nausea and vomiting Genitourinary: positive for vaginal discharge; negative for abnormal menstrual periods, genital lesions, sexual problems, dysuria and urinary incontinence Integument/breast: negative for breast lump, breast tenderness and nipple discharge Hematologic/lymphatic: negative for bleeding and easy bruising Musculoskeletal:negative for back pain and muscle weakness Neurological: negative for dizziness, headaches, vertigo and weakness Endocrine: negative for diabetic symptoms including polydipsia,  polyuria and skin dryness Allergic/Immunologic: negative for hay fever and urticaria     Objective:   BP 139/81 mmHg  Pulse 91  Ht 4\' 11"  (1.499 m)  Wt 263 lb (119.296 kg)  BMI 53.09 kg/m2  LMP 04/23/2015  General Appearance:    Alert, cooperative, no distress, appears stated age  Head:    Normocephalic, without obvious abnormality, atraumatic  Eyes:    PERRL, conjunctiva/corneas  clear, EOM's intact, both eyes  Ears:    Normal external ear canals, both ears  Nose:   Nares normal, septum midline, mucosa normal, no drainage or sinus tenderness  Throat:   Lips, mucosa, and tongue normal; teeth and gums normal  Neck:   Supple, symmetrical, trachea midline, no adenopathy;    thyroid:  no enlargement/tenderness/nodules; no carotid bruit or JVD  Back:     Symmetric, no curvature, ROM normal, no CVA tenderness  Lungs:     Clear to auscultation bilaterally, respirations unlabored  Chest Wall:    No tenderness or deformity   Heart:    Regular rate and rhythm, S1 and S2 normal, no murmur, rub or gallop  Breast Exam:    No tenderness, masses, or nipple abnormality  Abdomen:     Soft, non-tender, bowel sounds active all four quadrants, no masses, no organomegaly  Genitalia:    External vagina with abnormally lengthened labia minora.  Vagina with small amount of vaginal discharge, no odor.  Normal vaginal mucosa.  Cervix appears normal, no lesions.  Uterus slightly enlarged, ~ 12 week sized, mobile, nontender. Rectovaginal septum normal.   Rectal:    Normal tone, no masses or tenderness; internal exam not performed.   Extremities:   Extremities normal, atraumatic, no cyanosis or edema  Pulses:   2+ and symmetric all extremities  Skin:   Skin color, texture, turgor normal, no rashes or lesions  Lymph nodes:   Cervical, supraclavicular, and axillary nodes normal  Neurologic:   CNII-XII intact, normal strength, sensation and reflexes throughout     Lab Results  Component Value Date   WBC 9.4 04/13/2015   HGB 12.7 04/13/2015   HCT 39.0 04/13/2015   MCV 85.8 04/13/2015   PLT 305 04/13/2015     Chemistry      Component Value Date/Time   NA 138 04/13/2015 0742   K 3.6 04/13/2015 0742   CL 101 04/13/2015 0742   CO2 27 04/13/2015 0742   BUN 18 04/13/2015 0742   CREATININE 1.01* 04/13/2015 0742      Component Value Date/Time   CALCIUM 9.2 04/13/2015 0742   ALKPHOS 69  04/13/2015 0742   AST 20 04/13/2015 0742   ALT 20 04/13/2015 0742   BILITOT 0.4 04/13/2015 0742     Lab Results  Component Value Date   CHOL 233* 04/13/2015   HDL 63 04/13/2015   LDLCALC 143* 04/13/2015   TRIG 136 04/13/2015   CHOLHDL 3.7 04/13/2015   Lab Results  Component Value Date   HGBA1C 5.8 04/13/2015      Assessment:    Routine well woman exam.   Morbid Obesity   Plan:     Breast self exam technique reviewed and patient encouraged to perform self-exam monthly. Dietary diary. Discussed healthy lifestyle modifications.  Patient is planning on beginning an exercise program soon.  Mammogram ordered. Pap smear.   Patient desires screening for vaginitis/STIs due to recent episode of vaginal discharge.  Declines serologic STI testing.  Will perform Nuswab. Will notify of results.  Will  need colonoscopy by next year.   Will have PCP place referral.   Rubie Maid, MD [facility] 05/03/2015 9:02 PM

## 2015-05-04 LAB — PAP IG AND HPV HIGH-RISK
HPV, HIGH-RISK: NEGATIVE
PAP Smear Comment: 0

## 2015-05-06 LAB — NUSWAB VAGINITIS PLUS (VG+): CANDIDA GLABRATA, NAA: NEGATIVE

## 2015-05-08 ENCOUNTER — Telehealth: Payer: Self-pay

## 2015-05-08 DIAGNOSIS — A5901 Trichomonal vulvovaginitis: Secondary | ICD-10-CM

## 2015-05-08 MED ORDER — METRONIDAZOLE 500 MG PO TABS: ORAL_TABLET | ORAL | Status: DC

## 2015-05-08 NOTE — Telephone Encounter (Signed)
-----   Message from Rubie Maid, MD sent at 05/08/2015 12:10 PM EDT ----- Please treat for trichomonas, Flagyl 2 grams.  Pap negative.

## 2015-05-08 NOTE — Telephone Encounter (Signed)
Pt informed of negative PAP, and + trich. Pt informed of the need to refrain from sex for 7-14 days after her and partner have been treated. RX sent in for Flagyl.

## 2016-05-06 ENCOUNTER — Encounter: Payer: 59 | Admitting: Obstetrics and Gynecology

## 2016-05-21 DIAGNOSIS — M1712 Unilateral primary osteoarthritis, left knee: Secondary | ICD-10-CM | POA: Diagnosis not present

## 2016-05-21 DIAGNOSIS — R0683 Snoring: Secondary | ICD-10-CM | POA: Diagnosis not present

## 2016-05-21 DIAGNOSIS — M1711 Unilateral primary osteoarthritis, right knee: Secondary | ICD-10-CM | POA: Diagnosis not present

## 2016-05-21 DIAGNOSIS — E669 Obesity, unspecified: Secondary | ICD-10-CM | POA: Diagnosis not present

## 2016-05-21 DIAGNOSIS — I10 Essential (primary) hypertension: Secondary | ICD-10-CM | POA: Diagnosis not present

## 2016-05-23 DIAGNOSIS — G4730 Sleep apnea, unspecified: Secondary | ICD-10-CM | POA: Diagnosis not present

## 2016-06-03 ENCOUNTER — Encounter: Payer: 59 | Admitting: Obstetrics and Gynecology

## 2016-06-19 DIAGNOSIS — L219 Seborrheic dermatitis, unspecified: Secondary | ICD-10-CM | POA: Diagnosis not present

## 2016-07-10 ENCOUNTER — Ambulatory Visit (INDEPENDENT_AMBULATORY_CARE_PROVIDER_SITE_OTHER): Payer: 59 | Admitting: Obstetrics and Gynecology

## 2016-07-10 ENCOUNTER — Encounter: Payer: Self-pay | Admitting: Obstetrics and Gynecology

## 2016-07-10 VITALS — BP 120/82 | HR 77 | Ht 59.0 in | Wt 266.5 lb

## 2016-07-10 DIAGNOSIS — N951 Menopausal and female climacteric states: Secondary | ICD-10-CM

## 2016-07-10 DIAGNOSIS — Z01419 Encounter for gynecological examination (general) (routine) without abnormal findings: Secondary | ICD-10-CM

## 2016-07-10 DIAGNOSIS — Z1211 Encounter for screening for malignant neoplasm of colon: Secondary | ICD-10-CM | POA: Diagnosis not present

## 2016-07-10 DIAGNOSIS — K219 Gastro-esophageal reflux disease without esophagitis: Secondary | ICD-10-CM | POA: Diagnosis not present

## 2016-07-10 NOTE — Progress Notes (Signed)
GYNECOLOGY ANNUAL PHYSICAL EXAM PROGRESS NOTE  Subjective:    Colleen Gibbs is a 50 y.o. G0P0 female who presents for an annual exam. The patient has no complaints today. The patient is sexually active (same female partner x 3 years).The patient wears seatbelts: yes. The patient participates in regular exercise: no. Has the patient ever been transfused or tattooed?: no. The patient reports that there is not domestic violence in her life.    Gynecologic History Menarche age: 69 Patient's last menstrual period was 06/26/2016. Period Duration (Days): 4-5 Period Pattern: Regular Menstrual Flow: Moderate Dysmenorrhea: None  Contraception: condoms History of STI's: Trichomoniasis 2016, treated Last Pap: 05/02/2015. Results were: normal.  Denies h/o abnormal pap smears. Last mammogram: patient has never had mammogram.  Last Colonoscopy: never had.  Has pre-op today for scheduling colonoscopy.    Obstetric History   G0   P0   T0   P0   A0   L0    SAB0   TAB0   Ectopic0   Multiple0   Live Births0       Past Medical History:  Diagnosis Date  . Asthma    just uses inhaler when needed  . Hyperlipemia   . Hypertension     Past Surgical History:  Procedure Laterality Date  . LAPAROSCOPIC TUBAL LIGATION Bilateral 07/14/2013   Procedure: LAPAROSCOPIC TUBAL LIGATION;  Surgeon: Marvene Staff, MD;  Location: [facility];  Service: Gynecology;  Laterality: Bilateral;    Family History  Problem Relation Age of Onset  . Hypertension Mother   . Gout Mother   . Heart attack Mother   . Heart attack Father   . Gout Father   . Hypertension Father   . Diabetes Sister     Social History   Social History  . Marital status: Single    Spouse name: N/A  . Number of children: N/A  . Years of education: N/A   Occupational History  . Not on file.   Social History Main Topics  . Smoking status: Never Smoker  . Smokeless tobacco: Never Used  . Alcohol use No  . Drug use: No  .  Sexual activity: Yes    Birth control/ protection: Surgical   Other Topics Concern  . Not on file   Social History Narrative  . No narrative on file    Current Outpatient Prescriptions on File Prior to Visit  Medication Sig Dispense Refill  . amLODipine (NORVASC) 5 MG tablet Take 5 mg by mouth daily.    Marland Kitchen aspirin 81 MG tablet Take 81 mg by mouth daily.    Marland Kitchen lisinopril-hydrochlorothiazide (PRINZIDE,ZESTORETIC) 20-12.5 MG per tablet Take 1 tablet by mouth 2 (two) times daily.    . simvastatin (ZOCOR) 40 MG tablet Take 40 mg by mouth at bedtime.     No current facility-administered medications on file prior to visit.     No Known Allergies   Review of Systems Constitutional: negative for chills, fatigue, fevers and sweats Eyes: negative for irritation, redness and visual disturbance Ears, nose, mouth, throat, and face: negative for hearing loss, nasal congestion, snoring and tinnitus Respiratory: negative for asthma, cough, sputum Cardiovascular: negative for chest pain, dyspnea, exertional chest pressure/discomfort, irregular heart beat, palpitations and syncope Gastrointestinal: negative for abdominal pain, change in bowel habits, nausea and vomiting Genitourinary: Positive for abnormal menstrual periods (notes menstrual periods skipping occasionally, and also 1 episode of 2 periods in 1 month).  Negative for genital lesions, sexual problems and  vaginal discharge, dysuria and urinary incontinence Integument/breast: negative for breast lump, breast tenderness and nipple discharge Hematologic/lymphatic: negative for bleeding and easy bruising Musculoskeletal:negative for back pain and muscle weakness Neurological: negative for dizziness, headaches, vertigo and weakness Endocrine: negative for diabetic symptoms including polydipsia, polyuria and skin dryness Allergic/Immunologic: negative for hay fever and urticaria     Objective:  Blood pressure 120/82, pulse 77, height 4\' 11"   (1.499 m), weight 266 lb 8 oz (120.9 kg), last menstrual period 06/26/2016. Body mass index is 53.83 kg/m.    General Appearance:    Alert, cooperative, no distress, appears stated age, morbidly obese  Head:    Normocephalic, without obvious abnormality, atraumatic  Eyes:    PERRL, conjunctiva/corneas clear, EOM's intact, both eyes  Ears:    Normal external ear canals, both ears  Nose:   Nares normal, septum midline, mucosa normal, no drainage or sinus tenderness  Throat:   Lips, mucosa, and tongue normal; teeth and gums normal  Neck:   Supple, symmetrical, trachea midline, no adenopathy; thyroid: no enlargement/tenderness/nodules; no carotid bruit or JVD  Back:     Symmetric, no curvature, ROM normal, no CVA tenderness  Lungs:     Clear to auscultation bilaterally, respirations unlabored  Chest Wall:    No tenderness or deformity   Heart:    Regular rate and rhythm, S1 and S2 normal, no murmur, rub or gallop  Breast Exam:    No tenderness, masses, or nipple abnormality  Abdomen:     Soft, non-tender, bowel sounds active all four quadrants, no masses, no organomegaly.    Genitalia:    Pelvic:external genitalia normal, vagina without lesions, discharge, or tenderness, rectovaginal septum  normal. Cervix normal in appearance, no cervical motion tenderness, no adnexal masses or tenderness.  Uterus normal size, shape, mobile, regular contours, nontender.  Rectal:    Normal external sphincter.  No hemorrhoids appreciated. Internal exam not done.   Extremities:   Extremities normal, atraumatic, no cyanosis or edema  Pulses:   2+ and symmetric all extremities  Skin:   Skin color, texture, turgor normal, no rashes or lesions  Lymph nodes:   Cervical, supraclavicular, and axillary nodes normal  Neurologic:   CNII-XII intact, normal strength, sensation and reflexes throughout   .  Labs:  Lab Results  Component Value Date   WBC 9.4 04/13/2015   HGB 12.7 04/13/2015   HCT 39.0 04/13/2015   MCV  85.8 04/13/2015   PLT 305 04/13/2015    Lab Results  Component Value Date   CREATININE 1.01 (H) 04/13/2015   BUN 18 04/13/2015   NA 138 04/13/2015   K 3.6 04/13/2015   CL 101 04/13/2015   CO2 27 04/13/2015    Lab Results  Component Value Date   ALT 20 04/13/2015   AST 20 04/13/2015   ALKPHOS 69 04/13/2015   BILITOT 0.4 04/13/2015    No results found for: TSH   Assessment:   Routine gynecologic exam Perimenopausal status Morbidly obese     Plan:   Notes having annual labs done by PCP several months ago.  Breast self exam technique reviewed and patient encouraged to perform self-exam monthly. Contraception: post menopausal status. Discussed healthy lifestyle modifications. Pap smear up to date.  Mammogram ordered.  Colonoscopy to be scheduled later today at GI appointment.  Declines flu vaccine.  Discussed diagnosis of perimenopausal status in detail.  RTC in 1 year for annual exam.    Rubie Maid, MD [facility]

## 2016-07-10 NOTE — Patient Instructions (Signed)
Health Maintenance, Female Adopting a healthy lifestyle and getting preventive care can go a long way to promote health and wellness. Talk with your health care provider about what schedule of regular examinations is right for you. This is a good chance for you to check in with your provider about disease prevention and staying healthy. In between checkups, there are plenty of things you can do on your own. Experts have done a lot of research about which lifestyle changes and preventive measures are most likely to keep you healthy. Ask your health care provider for more information. WEIGHT AND DIET  Eat a healthy diet  Be sure to include plenty of vegetables, fruits, low-fat dairy products, and lean protein.  Do not eat a lot of foods high in solid fats, added sugars, or salt.  Get regular exercise. This is one of the most important things you can do for your health.  Most adults should exercise for at least 150 minutes each week. The exercise should increase your heart rate and make you sweat (moderate-intensity exercise).  Most adults should also do strengthening exercises at least twice a week. This is in addition to the moderate-intensity exercise.  Maintain a healthy weight  Body mass index (BMI) is a measurement that can be used to identify possible weight problems. It estimates body fat based on height and weight. Your health care provider can help determine your BMI and help you achieve or maintain a healthy weight.  For females 20 years of age and older:   A BMI below 18.5 is considered underweight.  A BMI of 18.5 to 24.9 is normal.  A BMI of 25 to 29.9 is considered overweight.  A BMI of 30 and above is considered obese.  Watch levels of cholesterol and blood lipids  You should start having your blood tested for lipids and cholesterol at 50 years of age, then have this test every 5 years.  You may need to have your cholesterol levels checked more often if:  Your lipid  or cholesterol levels are high.  You are older than 50 years of age.  You are at high risk for heart disease.  CANCER SCREENING   Lung Cancer  Lung cancer screening is recommended for adults 55-80 years old who are at high risk for lung cancer because of a history of smoking.  A yearly low-dose CT scan of the lungs is recommended for people who:  Currently smoke.  Have quit within the past 15 years.  Have at least a 30-pack-year history of smoking. A pack year is smoking an average of one pack of cigarettes a day for 1 year.  Yearly screening should continue until it has been 15 years since you quit.  Yearly screening should stop if you develop a health problem that would prevent you from having lung cancer treatment.  Breast Cancer  Practice breast self-awareness. This means understanding how your breasts normally appear and feel.  It also means doing regular breast self-exams. Let your health care provider know about any changes, no matter how small.  If you are in your 20s or 30s, you should have a clinical breast exam (CBE) by a health care provider every 1-3 years as part of a regular health exam.  If you are 40 or older, have a CBE every year. Also consider having a breast X-ray (mammogram) every year.  If you have a family history of breast cancer, talk to your health care provider about genetic screening.  If you   are at high risk for breast cancer, talk to your health care provider about having an MRI and a mammogram every year.  Breast cancer gene (BRCA) assessment is recommended for women who have family members with BRCA-related cancers. BRCA-related cancers include:  Breast.  Ovarian.  Tubal.  Peritoneal cancers.  Results of the assessment will determine the need for genetic counseling and BRCA1 and BRCA2 testing. Cervical Cancer Your health care provider may recommend that you be screened regularly for cancer of the pelvic organs (ovaries, uterus, and  vagina). This screening involves a pelvic examination, including checking for microscopic changes to the surface of your cervix (Pap test). You may be encouraged to have this screening done every 3 years, beginning at age 21.  For women ages 30-65, health care providers may recommend pelvic exams and Pap testing every 3 years, or they may recommend the Pap and pelvic exam, combined with testing for human papilloma virus (HPV), every 5 years. Some types of HPV increase your risk of cervical cancer. Testing for HPV may also be done on women of any age with unclear Pap test results.  Other health care providers may not recommend any screening for nonpregnant women who are considered low risk for pelvic cancer and who do not have symptoms. Ask your health care provider if a screening pelvic exam is right for you.  If you have had past treatment for cervical cancer or a condition that could lead to cancer, you need Pap tests and screening for cancer for at least 20 years after your treatment. If Pap tests have been discontinued, your risk factors (such as having a new sexual partner) need to be reassessed to determine if screening should resume. Some women have medical problems that increase the chance of getting cervical cancer. In these cases, your health care provider may recommend more frequent screening and Pap tests. Colorectal Cancer  This type of cancer can be detected and often prevented.  Routine colorectal cancer screening usually begins at 50 years of age and continues through 50 years of age.  Your health care provider may recommend screening at an earlier age if you have risk factors for colon cancer.  Your health care provider may also recommend using home test kits to check for hidden blood in the stool.  A small camera at the end of a tube can be used to examine your colon directly (sigmoidoscopy or colonoscopy). This is done to check for the earliest forms of colorectal  cancer.  Routine screening usually begins at age 50.  Direct examination of the colon should be repeated every 5-10 years through 50 years of age. However, you may need to be screened more often if early forms of precancerous polyps or small growths are found. Skin Cancer  Check your skin from head to toe regularly.  Tell your health care provider about any new moles or changes in moles, especially if there is a change in a mole's shape or color.  Also tell your health care provider if you have a mole that is larger than the size of a pencil eraser.  Always use sunscreen. Apply sunscreen liberally and repeatedly throughout the day.  Protect yourself by wearing long sleeves, pants, a wide-brimmed hat, and sunglasses whenever you are outside. HEART DISEASE, DIABETES, AND HIGH BLOOD PRESSURE   High blood pressure causes heart disease and increases the risk of stroke. High blood pressure is more likely to develop in:  People who have blood pressure in the high end   of the normal range (130-139/85-89 mm Hg).  People who are overweight or obese.  People who are African American.  If you are 38-23 years of age, have your blood pressure checked every 3-5 years. If you are 61 years of age or older, have your blood pressure checked every year. You should have your blood pressure measured twice--once when you are at a hospital or clinic, and once when you are not at a hospital or clinic. Record the average of the two measurements. To check your blood pressure when you are not at a hospital or clinic, you can use:  An automated blood pressure machine at a pharmacy.  A home blood pressure monitor.  If you are between 45 years and 39 years old, ask your health care provider if you should take aspirin to prevent strokes.  Have regular diabetes screenings. This involves taking a blood sample to check your fasting blood sugar level.  If you are at a normal weight and have a low risk for diabetes,  have this test once every three years after 50 years of age.  If you are overweight and have a high risk for diabetes, consider being tested at a younger age or more often. PREVENTING INFECTION  Hepatitis B  If you have a higher risk for hepatitis B, you should be screened for this virus. You are considered at high risk for hepatitis B if:  You were born in a country where hepatitis B is common. Ask your health care provider which countries are considered high risk.  Your parents were born in a high-risk country, and you have not been immunized against hepatitis B (hepatitis B vaccine).  You have HIV or AIDS.  You use needles to inject street drugs.  You live with someone who has hepatitis B.  You have had sex with someone who has hepatitis B.  You get hemodialysis treatment.  You take certain medicines for conditions, including cancer, organ transplantation, and autoimmune conditions. Hepatitis C  Blood testing is recommended for:  Everyone born from [birth year] through 1965.  Anyone with known risk factors for hepatitis C. Sexually transmitted infections (STIs)  You should be screened for sexually transmitted infections (STIs) including gonorrhea and chlamydia if:  You are sexually active and are younger than 50 years of age.  You are older than 50 years of age and your health care provider tells you that you are at risk for this type of infection.  Your sexual activity has changed since you were last screened and you are at an increased risk for chlamydia or gonorrhea. Ask your health care provider if you are at risk.  If you do not have HIV, but are at risk, it may be recommended that you take a prescription medicine daily to prevent HIV infection. This is called pre-exposure prophylaxis (PrEP). You are considered at risk if:  You are sexually active and do not regularly use condoms or know the HIV status of your partner(s).  You take drugs by injection.  You are sexually  active with a partner who has HIV. Talk with your health care provider about whether you are at high risk of being infected with HIV. If you choose to begin PrEP, you should first be tested for HIV. You should then be tested every 3 months for as long as you are taking PrEP.  PREGNANCY   If you are premenopausal and you may become pregnant, ask your health care provider about preconception counseling.  If you may  become pregnant, take 400 to 800 micrograms (mcg) of folic acid every day.  If you want to prevent pregnancy, talk to your health care provider about birth control (contraception). OSTEOPOROSIS AND MENOPAUSE   Osteoporosis is a disease in which the bones lose minerals and strength with aging. This can result in serious bone fractures. Your risk for osteoporosis can be identified using a bone density scan.  If you are 61 years of age or older, or if you are at risk for osteoporosis and fractures, ask your health care provider if you should be screened.  Ask your health care provider whether you should take a calcium or vitamin D supplement to lower your risk for osteoporosis.  Menopause may have certain physical symptoms and risks.  Hormone replacement therapy may reduce some of these symptoms and risks. Talk to your health care provider about whether hormone replacement therapy is right for you.  HOME CARE INSTRUCTIONS   Schedule regular health, dental, and eye exams.  Stay current with your immunizations.   Do not use any tobacco products including cigarettes, chewing tobacco, or electronic cigarettes.  If you are pregnant, do not drink alcohol.  If you are breastfeeding, limit how much and how often you drink alcohol.  Limit alcohol intake to no more than 1 drink per day for nonpregnant women. One drink equals 12 ounces of beer, 5 ounces of wine, or 1 ounces of hard liquor.  Do not use street drugs.  Do not share needles.  Ask your health care provider for help if  you need support or information about quitting drugs.  Tell your health care provider if you often feel depressed.  Tell your health care provider if you have ever been abused or do not feel safe at home.   This information is not intended to replace advice given to you by your health care provider. Make sure you discuss any questions you have with your health care provider.   Document Released: 04/14/2011 Document Revised: 10/20/2014 Document Reviewed: 08/31/2013 Elsevier Interactive Patient Education [employer].

## 2016-07-16 DIAGNOSIS — L732 Hidradenitis suppurativa: Secondary | ICD-10-CM | POA: Diagnosis not present

## 2016-08-04 DIAGNOSIS — L732 Hidradenitis suppurativa: Secondary | ICD-10-CM | POA: Diagnosis not present

## 2016-08-20 DIAGNOSIS — L732 Hidradenitis suppurativa: Secondary | ICD-10-CM | POA: Diagnosis not present

## 2016-08-29 DIAGNOSIS — D124 Benign neoplasm of descending colon: Secondary | ICD-10-CM | POA: Diagnosis not present

## 2016-08-29 DIAGNOSIS — D125 Benign neoplasm of sigmoid colon: Secondary | ICD-10-CM | POA: Diagnosis not present

## 2016-08-29 DIAGNOSIS — D122 Benign neoplasm of ascending colon: Secondary | ICD-10-CM | POA: Diagnosis not present

## 2016-08-29 DIAGNOSIS — K635 Polyp of colon: Secondary | ICD-10-CM | POA: Diagnosis not present

## 2016-08-29 DIAGNOSIS — Z1211 Encounter for screening for malignant neoplasm of colon: Secondary | ICD-10-CM | POA: Diagnosis not present

## 2016-09-08 DIAGNOSIS — L732 Hidradenitis suppurativa: Secondary | ICD-10-CM | POA: Diagnosis not present

## 2016-09-08 DIAGNOSIS — Z6841 Body Mass Index (BMI) 40.0 and over, adult: Secondary | ICD-10-CM | POA: Diagnosis not present

## 2016-09-10 ENCOUNTER — Ambulatory Visit (INDEPENDENT_AMBULATORY_CARE_PROVIDER_SITE_OTHER): Payer: 59 | Admitting: Obstetrics and Gynecology

## 2016-09-10 ENCOUNTER — Encounter: Payer: Self-pay | Admitting: Obstetrics and Gynecology

## 2016-09-10 VITALS — BP 134/82 | HR 90 | Ht 59.0 in | Wt 265.3 lb

## 2016-09-10 DIAGNOSIS — B9689 Other specified bacterial agents as the cause of diseases classified elsewhere: Secondary | ICD-10-CM

## 2016-09-10 DIAGNOSIS — N760 Acute vaginitis: Secondary | ICD-10-CM | POA: Diagnosis not present

## 2016-09-10 MED ORDER — METRONIDAZOLE 500 MG PO TABS: 500.0000 mg | ORAL_TABLET | Freq: Two times a day (BID) | ORAL | 0 refills | Status: DC

## 2016-09-10 NOTE — Patient Instructions (Signed)
Bacterial Vaginosis Bacterial vaginosis is a vaginal infection that occurs when the normal balance of bacteria in the vagina is disrupted. It results from an overgrowth of certain bacteria. This is the most common vaginal infection among women ages 15-44. Because bacterial vaginosis increases your risk for STIs (sexually transmitted infections), getting treated can help reduce your risk for chlamydia, gonorrhea, herpes, and HIV (human immunodeficiency virus). Treatment is also important for preventing complications in pregnant women, because this condition can cause an early (premature) delivery. What are the causes? This condition is caused by an increase in harmful bacteria that are normally present in small amounts in the vagina. However, the reason that the condition develops is not fully understood. What increases the risk? The following factors may make you more likely to develop this condition:  Having a new sexual partner or multiple sexual partners.  Having unprotected sex.  Douching.  Having an intrauterine device (IUD).  Smoking.  Drug and alcohol abuse.  Taking certain antibiotic medicines.  Being pregnant.  You cannot get bacterial vaginosis from toilet seats, bedding, swimming pools, or contact with objects around you. What are the signs or symptoms? Symptoms of this condition include:  Grey or white vaginal discharge. The discharge can also be watery or foamy.  A fish-like odor with discharge, especially after sexual intercourse or during menstruation.  Itching in and around the vagina.  Burning or pain with urination.  Some women with bacterial vaginosis have no signs or symptoms. How is this diagnosed? This condition is diagnosed based on:  Your medical history.  A physical exam of the vagina.  Testing a sample of vaginal fluid under a microscope to look for a large amount of bad bacteria or abnormal cells. Your health care provider may use a cotton swab  or a small wooden spatula to collect the sample.  How is this treated? This condition is treated with antibiotics. These may be given as a pill, a vaginal cream, or a medicine that is put into the vagina (suppository). If the condition comes back after treatment, a second round of antibiotics may be needed. Follow these instructions at home: Medicines  Take over-the-counter and prescription medicines only as told by your health care provider.  Take or use your antibiotic as told by your health care provider. Do not stop taking or using the antibiotic even if you start to feel better. General instructions  If you have a female sexual partner, tell her that you have a vaginal infection. She should see her health care provider and be treated if she has symptoms. If you have a female sexual partner, he does not need treatment.  During treatment: ? Avoid sexual activity until you finish treatment. ? Do not douche. ? Avoid alcohol as directed by your health care provider. ? Avoid breastfeeding as directed by your health care provider.  Drink enough water and fluids to keep your urine clear or pale yellow.  Keep the area around your vagina and rectum clean. ? Wash the area daily with warm water. ? Wipe yourself from front to back after using the toilet.  Keep all follow-up visits as told by your health care provider. This is important. How is this prevented?  Do not douche.  Wash the outside of your vagina with warm water only.  Use protection when having sex. This includes latex condoms and dental dams.  Limit how many sexual partners you have. To help prevent bacterial vaginosis, it is best to have sex with just   one partner (monogamous).  Make sure you and your sexual partner are tested for STIs.  Wear cotton or cotton-lined underwear.  Avoid wearing tight pants and pantyhose, especially during summer.  Limit the amount of alcohol that you drink.  Do not use any products that  contain nicotine or tobacco, such as cigarettes and e-cigarettes. If you need help quitting, ask your health care provider.  Do not use illegal drugs. Where to find more information:  Centers for Disease Control and Prevention: www.cdc.gov/std  American Sexual Health Association (ASHA): www.ashastd.org  [place]. Department of Health and Human Services, Office on Women's Health: www.womenshealth.gov/ or https://www.womenshealth.gov/a-z-topics/bacterial-vaginosis Contact a health care provider if:  Your symptoms do not improve, even after treatment.  You have more discharge or pain when urinating.  You have a fever.  You have pain in your abdomen.  You have pain during sex.  You have vaginal bleeding between periods. Summary  Bacterial vaginosis is a vaginal infection that occurs when the normal balance of bacteria in the vagina is disrupted.  Because bacterial vaginosis increases your risk for STIs (sexually transmitted infections), getting treated can help reduce your risk for chlamydia, gonorrhea, herpes, and HIV (human immunodeficiency virus). Treatment is also important for preventing complications in pregnant women, because the condition can cause an early (premature) delivery.  This condition is treated with antibiotic medicines. These may be given as a pill, a vaginal cream, or a medicine that is put into the vagina (suppository). This information is not intended to replace advice given to you by your health care provider. Make sure you discuss any questions you have with your health care provider. Document Released: 09/29/2005 Document Revised: 06/14/2016 Document Reviewed: 06/14/2016 Elsevier Interactive Patient Education  2017 [employer]  

## 2016-09-10 NOTE — Progress Notes (Signed)
   GYNECOLOGY CLINIC PROGRESS NOTE  Subjective:     Colleen Gibbs is a 50 y.o. female who presents for evaluation of an abnormal vaginal discharge. Symptoms have been present for 2 weeks. Vaginal symptoms: discharge described as white and thin, no odor and vulvar itching. Contraception: tubal ligation. She denies abnormal bleeding, bumps, lesions and post coital bleeding Sexually transmitted infection risk: very low risk of STD exposure. Menstrual flow: regular every 28-30 days. Has been on recent antibiotic course for treatment of hydradenitis suppurativa.   The following portions of the patient's history were reviewed and updated as appropriate: allergies, current medications, past family history, past medical history, past social history, past surgical history and problem list.   Review of Systems Pertinent items noted in HPI and remainder of comprehensive ROS otherwise negative.    Objective:    BP 134/82 (BP Location: Left Arm, Patient Position: Sitting, Cuff Size: Large)   Pulse 90   Ht 4\' 11"  (1.499 m)   Wt 265 lb 4.8 oz (120.3 kg)   LMP 09/10/2016   BMI 53.58 kg/m  General appearance: alert and no distress, morbidly obese Abdomen: soft, non-tender; bowel sounds normal; no masses,  no organomegaly Pelvic: external genitalia normal, rectovaginal septum normal.  Vagina without discharge.  Cervix normal appearing, no lesions and no motion tenderness.  Uterus mobile, nontender, normal shape and size.  Adnexae non-palpable, nontender bilaterally.     Microscopic wet-mount exam show clue cells. Negative for trichomonads, budding yeast or hyphae. KOH done.  Assessment:    Bacterial vaginosis (likely due to recent antibiotic use).    Plan:    Symptomatic local care discussed. Oral antibiotics see orders. Follow up as needed if symptoms worsen or do not improve.      Rubie Maid, MD [facility]

## 2016-09-24 DIAGNOSIS — Z6841 Body Mass Index (BMI) 40.0 and over, adult: Secondary | ICD-10-CM | POA: Diagnosis not present

## 2016-09-24 DIAGNOSIS — L732 Hidradenitis suppurativa: Secondary | ICD-10-CM | POA: Diagnosis not present

## 2016-11-26 DIAGNOSIS — L732 Hidradenitis suppurativa: Secondary | ICD-10-CM | POA: Diagnosis not present

## 2016-11-26 DIAGNOSIS — I10 Essential (primary) hypertension: Secondary | ICD-10-CM | POA: Diagnosis not present

## 2016-12-31 DIAGNOSIS — L732 Hidradenitis suppurativa: Secondary | ICD-10-CM | POA: Diagnosis not present

## 2016-12-31 DIAGNOSIS — I10 Essential (primary) hypertension: Secondary | ICD-10-CM | POA: Diagnosis not present

## 2017-02-09 DIAGNOSIS — L732 Hidradenitis suppurativa: Secondary | ICD-10-CM | POA: Diagnosis not present

## 2017-03-11 DIAGNOSIS — L732 Hidradenitis suppurativa: Secondary | ICD-10-CM | POA: Diagnosis not present

## 2017-05-11 DIAGNOSIS — L732 Hidradenitis suppurativa: Secondary | ICD-10-CM | POA: Diagnosis not present

## 2017-06-22 DIAGNOSIS — L732 Hidradenitis suppurativa: Secondary | ICD-10-CM | POA: Diagnosis not present

## 2017-06-22 DIAGNOSIS — L219 Seborrheic dermatitis, unspecified: Secondary | ICD-10-CM | POA: Diagnosis not present

## 2017-07-13 DIAGNOSIS — L732 Hidradenitis suppurativa: Secondary | ICD-10-CM | POA: Diagnosis not present

## 2017-07-14 ENCOUNTER — Encounter: Payer: 59 | Admitting: Obstetrics and Gynecology

## 2017-09-14 ENCOUNTER — Encounter: Payer: Self-pay | Admitting: Obstetrics and Gynecology

## 2017-09-14 ENCOUNTER — Ambulatory Visit (INDEPENDENT_AMBULATORY_CARE_PROVIDER_SITE_OTHER): Payer: 59 | Admitting: Obstetrics and Gynecology

## 2017-09-14 VITALS — BP 143/78 | HR 88 | Ht 59.0 in | Wt 277.3 lb

## 2017-09-14 DIAGNOSIS — Z01419 Encounter for gynecological examination (general) (routine) without abnormal findings: Secondary | ICD-10-CM | POA: Diagnosis not present

## 2017-09-14 DIAGNOSIS — N924 Excessive bleeding in the premenopausal period: Secondary | ICD-10-CM

## 2017-09-14 DIAGNOSIS — I10 Essential (primary) hypertension: Secondary | ICD-10-CM

## 2017-09-14 DIAGNOSIS — Z1231 Encounter for screening mammogram for malignant neoplasm of breast: Secondary | ICD-10-CM

## 2017-09-14 MED ORDER — NORETHINDRONE ACETATE 5 MG PO TABS: 5.0000 mg | ORAL_TABLET | Freq: Every day | ORAL | 2 refills | Status: DC

## 2017-09-14 NOTE — Progress Notes (Signed)
Pt is here for an annual pap smear and physical. Also due for a mammogram. C/o very heavy periods. Also becoming very irregular.

## 2017-09-14 NOTE — Progress Notes (Signed)
GYNECOLOGY ANNUAL PHYSICAL EXAM PROGRESS NOTE  Subjective:    Colleen Gibbs is a 51 y.o. G0P0 female who presents for an annual exam.  The patient is sexually active (same female partner x 4 years).The patient wears seatbelts: yes. The patient participates in regular exercise: no. Has the patient ever been transfused or tattooed?: no. The patient reports that there is not domestic violence in her life.    The patient has the following complaints today:  1. Patient complains of heavier menstrual cycles since May, using both pads and tampons, requiring changing every hour.  Cycles sometimes lasting for almost 2 weeks. Prior to this was noting skipped cycles.   Gynecologic History Menarche age: 81 Patient's last menstrual period was 08/18/2017 (approximate). Contraception: condoms History of STI's: Trichomoniasis 2016, treated Last Pap: 05/02/2015. Results were: normal.  Denies h/o abnormal pap smears. Last mammogram: patient has never had mammogram.  Last Colonoscopy: 2017.  Had benign polyps.  Due for f/u in 5 years.    Obstetric History   G0   P0   T0   P0   A0   L0    SAB0   TAB0   Ectopic0   Multiple0   Live Births0       Past Medical History:  Diagnosis Date  . Asthma    just uses inhaler when needed  . Hyperlipemia   . Hypertension     Past Surgical History:  Procedure Laterality Date  . LAPAROSCOPIC TUBAL LIGATION Bilateral 07/14/2013   Procedure: LAPAROSCOPIC TUBAL LIGATION;  Surgeon: Marvene Staff, MD;  Location: [facility];  Service: Gynecology;  Laterality: Bilateral;    Family History  Problem Relation Age of Onset  . Hypertension Mother   . Gout Mother   . Heart attack Mother   . Heart attack Father   . Gout Father   . Hypertension Father   . Diabetes Sister     Social History   Socioeconomic History  . Marital status: Single    Spouse name: Not on file  . Number of children: Not on file  . Years of education: Not on file  . Highest  education level: Not on file  Social Needs  . Financial resource strain: Not on file  . Food insecurity - worry: Not on file  . Food insecurity - inability: Not on file  . Transportation needs - medical: Not on file  . Transportation needs - non-medical: Not on file  Occupational History  . Not on file  Tobacco Use  . Smoking status: Never Smoker  . Smokeless tobacco: Never Used  Substance and Sexual Activity  . Alcohol use: No  . Drug use: No  . Sexual activity: Yes    Birth control/protection: Surgical  Other Topics Concern  . Not on file  Social History Narrative  . Not on file    Current Outpatient Medications on File Prior to Visit  Medication Sig Dispense Refill  . amLODipine (NORVASC) 5 MG tablet Take 5 mg by mouth daily.    Marland Kitchen doxycycline (VIBRAMYCIN) 100 MG capsule   2  . hydrocortisone 2.5 % cream   11  . ketoconazole (NIZORAL) 2 % cream   11  . ketoconazole (NIZORAL) 2 % shampoo   11  . lisinopril-hydrochlorothiazide (PRINZIDE,ZESTORETIC) 20-12.5 MG per tablet Take 1 tablet by mouth 2 (two) times daily.    . mupirocin ointment (BACTROBAN) 2 %   2  . omeprazole (PRILOSEC) 20 MG capsule   1  .  simvastatin (ZOCOR) 40 MG tablet Take 40 mg by mouth at bedtime.     No current facility-administered medications on file prior to visit.     No Known Allergies   Review of Systems Constitutional: negative for chills, fatigue, fevers and sweats Eyes: negative for irritation, redness and visual disturbance Ears, nose, mouth, throat, and face: negative for hearing loss, nasal congestion, snoring and tinnitus Respiratory: negative for asthma, cough, sputum Cardiovascular: negative for chest pain, dyspnea, exertional chest pressure/discomfort, irregular heart beat, palpitations and syncope Gastrointestinal: negative for abdominal pain, change in bowel habits, nausea and vomiting Genitourinary: Positive for abnormal menstrual periods (see HPI).  Negative for genital lesions,  sexual problems and vaginal discharge, dysuria and urinary incontinence Integument/breast: negative for breast lump, breast tenderness and nipple discharge Hematologic/lymphatic: negative for bleeding and easy bruising Musculoskeletal:negative for back pain and muscle weakness Neurological: negative for dizziness, headaches, vertigo and weakness Endocrine: negative for diabetic symptoms including polydipsia, polyuria and skin dryness Allergic/Immunologic: negative for hay fever and urticaria     Objective:  Blood pressure (!) 143/78, pulse 88, height 4\' 11"  (1.499 m), weight 277 lb 5 oz (125.8 kg), last menstrual period 08/18/2017. Body mass index is 56.01 kg/m.   General Appearance:    Alert, cooperative, no distress, appears stated age, morbidly obese  Head:    Normocephalic, without obvious abnormality, atraumatic  Eyes:    PERRL, conjunctiva/corneas clear, EOM's intact, both eyes  Ears:    Normal external ear canals, both ears  Nose:   Nares normal, septum midline, mucosa normal, no drainage or sinus tenderness  Throat:   Lips, mucosa, and tongue normal; teeth and gums normal  Neck:   Supple, symmetrical, trachea midline, no adenopathy; thyroid: no enlargement/tenderness/nodules; no carotid bruit or JVD  Back:     Symmetric, no curvature, ROM normal, no CVA tenderness  Lungs:     Clear to auscultation bilaterally, respirations unlabored  Chest Wall:    No tenderness or deformity   Heart:    Regular rate and rhythm, S1 and S2 normal, no murmur, rub or gallop  Breast Exam:    No tenderness, masses, or nipple abnormality  Abdomen:     Soft, non-tender, bowel sounds active all four quadrants, no masses, no organomegaly.    Genitalia:    Pelvic:external genitalia normal, vagina without lesions, discharge, or tenderness, rectovaginal septum  normal. Cervix normal in appearance, no cervical motion tenderness, no adnexal masses or tenderness.  Uterus normal size, shape, mobile, regular  contours, nontender.  Rectal:    Normal external sphincter.  No hemorrhoids appreciated. Internal exam not done.   Extremities:   Extremities normal, atraumatic, no cyanosis or edema  Pulses:   2+ and symmetric all extremities  Skin:   Skin color, texture, turgor normal, no rashes or lesions  Lymph nodes:   Cervical, supraclavicular, and axillary nodes normal  Neurologic:   CNII-XII intact, normal strength, sensation and reflexes throughout    Labs:  Lab Results  Component Value Date   WBC 9.4 04/13/2015   HGB 12.7 04/13/2015   HCT 39.0 04/13/2015   MCV 85.8 04/13/2015   PLT 305 04/13/2015    Lab Results  Component Value Date   CREATININE 1.01 (H) 04/13/2015   BUN 18 04/13/2015   NA 138 04/13/2015   K 3.6 04/13/2015   CL 101 04/13/2015   CO2 27 04/13/2015    Lab Results  Component Value Date   ALT 20 04/13/2015   AST 20 04/13/2015  ALKPHOS 69 04/13/2015   BILITOT 0.4 04/13/2015    No results found for: TSH   Assessment:   Routine gynecologic exam Perimenopausal abnormal bleeding Morbidly obese   HTN  Plan:   Labs ordered: CBC, CMP, Lipid panel, TSH.   Breast self exam technique reviewed and patient encouraged to perform self-exam monthly. Contraception: condoms Discussed healthy lifestyle modifications. Pap smear up to date.  Mammogram ordered.  Colonoscopy up to date Declines flu vaccine.  Reiterated diagnosis of perimenopausal status in detail. Discussed management options for perimenopausal bleeding.  Will prescribe Aygestin 5 mg PO daily.  Will trial for 3 months. If no improvement, patient will need to f/u for further workup (endometrial biopsy, ultrasound).  HTN, mildly elevated today. To be managed by PCP.  RTC in 1 year for annual exam. Return in 3 months to f/u with bleeding.     Rubie Maid, MD [facility]

## 2017-09-15 LAB — COMPREHENSIVE METABOLIC PANEL
A/G RATIO: 1.3 (ref 1.2–2.2)
ALT: 13 IU/L (ref 0–32)
AST: 11 IU/L (ref 0–40)
Albumin: 4 g/dL (ref 3.5–5.5)
Alkaline Phosphatase: 89 IU/L (ref 39–117)
BUN/Creatinine Ratio: 11 (ref 9–23)
BUN: 10 mg/dL (ref 6–24)
Bilirubin Total: 0.2 mg/dL (ref 0.0–1.2)
CALCIUM: 9.4 mg/dL (ref 8.7–10.2)
CHLORIDE: 99 mmol/L (ref 96–106)
CO2: 26 mmol/L (ref 20–29)
Creatinine, Ser: 0.93 mg/dL (ref 0.57–1.00)
GFR calc Af Amer: 82 mL/min/{1.73_m2} (ref >59)
GFR, EST NON AFRICAN AMERICAN: 71 mL/min/{1.73_m2} (ref >59)
GLOBULIN, TOTAL: 3 g/dL (ref 1.5–4.5)
Glucose: 88 mg/dL (ref 65–99)
POTASSIUM: 4.4 mmol/L (ref 3.5–5.2)
Sodium: 142 mmol/L (ref 134–144)
Total Protein: 7 g/dL (ref 6.0–8.5)

## 2017-09-15 LAB — LIPID PANEL
CHOLESTEROL TOTAL: 205 mg/dL — AB (ref 100–199)
Chol/HDL Ratio: 2.7 ratio (ref 0.0–4.4)
HDL: 76 mg/dL (ref >39)
LDL CALC: 111 mg/dL — AB (ref 0–99)
TRIGLYCERIDES: 89 mg/dL (ref 0–149)
VLDL Cholesterol Cal: 18 mg/dL (ref 5–40)

## 2017-09-15 LAB — CBC
HEMATOCRIT: 36.9 % (ref 34.0–46.6)
Hemoglobin: 11.5 g/dL (ref 11.1–15.9)
MCH: 24.8 pg — AB (ref 26.6–33.0)
MCHC: 31.2 g/dL — ABNORMAL LOW (ref 31.5–35.7)
MCV: 80 fL (ref 79–97)
PLATELETS: 388 10*3/uL — AB (ref 150–379)
RBC: 4.63 x10E6/uL (ref 3.77–5.28)
RDW: 18 % — AB (ref 12.3–15.4)
WBC: 12 10*3/uL — AB (ref 3.4–10.8)

## 2017-09-15 LAB — TSH: TSH: 1.35 u[IU]/mL (ref 0.450–4.500)

## 2017-10-21 DIAGNOSIS — E669 Obesity, unspecified: Secondary | ICD-10-CM | POA: Diagnosis not present

## 2017-10-21 DIAGNOSIS — E785 Hyperlipidemia, unspecified: Secondary | ICD-10-CM | POA: Diagnosis not present

## 2017-10-21 DIAGNOSIS — M25562 Pain in left knee: Secondary | ICD-10-CM | POA: Diagnosis not present

## 2017-10-21 DIAGNOSIS — L732 Hidradenitis suppurativa: Secondary | ICD-10-CM | POA: Diagnosis not present

## 2017-10-21 DIAGNOSIS — I10 Essential (primary) hypertension: Secondary | ICD-10-CM | POA: Diagnosis not present

## 2017-12-14 DIAGNOSIS — L732 Hidradenitis suppurativa: Secondary | ICD-10-CM | POA: Diagnosis not present

## 2017-12-15 ENCOUNTER — Ambulatory Visit: Payer: 59 | Admitting: Obstetrics and Gynecology

## 2017-12-15 ENCOUNTER — Encounter: Payer: Self-pay | Admitting: Obstetrics and Gynecology

## 2017-12-15 VITALS — BP 168/82 | HR 96 | Ht 60.0 in | Wt 272.9 lb

## 2017-12-15 DIAGNOSIS — I10 Essential (primary) hypertension: Secondary | ICD-10-CM

## 2017-12-15 DIAGNOSIS — N924 Excessive bleeding in the premenopausal period: Secondary | ICD-10-CM

## 2017-12-15 MED ORDER — NORETHINDRONE ACETATE 5 MG PO TABS: 5.0000 mg | ORAL_TABLET | Freq: Every day | ORAL | 11 refills | Status: AC

## 2017-12-15 NOTE — Progress Notes (Signed)
Pt stated that norethindrone is working well.

## 2017-12-15 NOTE — Progress Notes (Signed)
    GYNECOLOGY PROGRESS NOTE  Subjective:    Patient ID: Colleen Gibbs, female    DOB: 1965/12/17, 52 y.o.   MRN: [id number]  HPI  Patient is a 52 y.o. G0P0000 female who presents for 3 month f/u of medications.  She was initiated on Aygestin 5 mg for menorrhagia (likely perimenopausal bleeding).  Patient notes that her periods have improved significantly. She is very pleased with the medication and would like to continue. Denies any undesirable side effects.   The following portions of the patient's history were reviewed and updated as appropriate: allergies, current medications, past family history, past medical history, past social history, past surgical history and problem list.  Review of Systems Pertinent items noted in HPI and remainder of comprehensive ROS otherwise negative.   Objective:   Blood pressure (!) 168/82, pulse 96, height 5' (1.524 m), weight 272 lb 14.4 oz (123.8 kg), last menstrual period 12/12/2017. General appearance: alert and no distress Remainder of exam deferred.   Assessment:   Perimenopausal menorrhagia Morbid obesity HTN  Plan:   - Perimenopausal menorrhagia managed with Aygestin.  New prescription given.  - Patient with risk factors for abnormal menses and hyperplasia (morbid obesity, age > 1).  If bleeding becomes irregular again, will complete workup with endometrial biopsy to r/o malignancy.  - HTN currently uncontrolled.  Patient notes compliance with medications, but notes she just took medications prior to coming to her appointment. Continue to encourage compliance with all medications and f/u with PCP when indicated.    Rubie Maid, MD [facility]

## 2018-02-24 DIAGNOSIS — L732 Hidradenitis suppurativa: Secondary | ICD-10-CM | POA: Diagnosis not present

## 2018-02-24 DIAGNOSIS — I10 Essential (primary) hypertension: Secondary | ICD-10-CM | POA: Diagnosis not present

## 2018-03-24 DIAGNOSIS — N390 Urinary tract infection, site not specified: Secondary | ICD-10-CM | POA: Diagnosis not present

## 2018-03-24 DIAGNOSIS — L732 Hidradenitis suppurativa: Secondary | ICD-10-CM | POA: Diagnosis not present

## 2018-03-24 DIAGNOSIS — Z0000 Encounter for general adult medical examination without abnormal findings: Secondary | ICD-10-CM | POA: Diagnosis not present

## 2018-03-24 DIAGNOSIS — K5900 Constipation, unspecified: Secondary | ICD-10-CM | POA: Diagnosis not present

## 2018-03-24 DIAGNOSIS — I10 Essential (primary) hypertension: Secondary | ICD-10-CM | POA: Diagnosis not present

## 2018-03-24 DIAGNOSIS — E669 Obesity, unspecified: Secondary | ICD-10-CM | POA: Diagnosis not present

## 2018-05-10 DIAGNOSIS — Z6841 Body Mass Index (BMI) 40.0 and over, adult: Secondary | ICD-10-CM | POA: Diagnosis not present

## 2018-05-10 DIAGNOSIS — I10 Essential (primary) hypertension: Secondary | ICD-10-CM | POA: Diagnosis not present

## 2018-08-02 DIAGNOSIS — I10 Essential (primary) hypertension: Secondary | ICD-10-CM | POA: Diagnosis not present

## 2018-09-01 DIAGNOSIS — L732 Hidradenitis suppurativa: Secondary | ICD-10-CM | POA: Diagnosis not present

## 2018-09-01 DIAGNOSIS — K5900 Constipation, unspecified: Secondary | ICD-10-CM | POA: Diagnosis not present

## 2018-09-01 DIAGNOSIS — I10 Essential (primary) hypertension: Secondary | ICD-10-CM | POA: Diagnosis not present

## 2018-09-15 ENCOUNTER — Encounter: Payer: Self-pay | Admitting: Obstetrics and Gynecology

## 2018-09-15 ENCOUNTER — Ambulatory Visit (INDEPENDENT_AMBULATORY_CARE_PROVIDER_SITE_OTHER): Payer: 59 | Admitting: Obstetrics and Gynecology

## 2018-09-15 ENCOUNTER — Other Ambulatory Visit (HOSPITAL_COMMUNITY)
Admission: RE | Admit: 2018-09-15 | Discharge: 2018-09-15 | Disposition: A | Discharge status: Home / Self Care | Payer: 59 | Source: Ambulatory Visit | Attending: Obstetrics and Gynecology | Admitting: Obstetrics and Gynecology

## 2018-09-15 VITALS — BP 169/80 | HR 82 | Ht 60.0 in | Wt 281.1 lb

## 2018-09-15 DIAGNOSIS — Z01419 Encounter for gynecological examination (general) (routine) without abnormal findings: Secondary | ICD-10-CM | POA: Diagnosis not present

## 2018-09-15 DIAGNOSIS — Z1239 Encounter for other screening for malignant neoplasm of breast: Secondary | ICD-10-CM

## 2018-09-15 DIAGNOSIS — I10 Essential (primary) hypertension: Secondary | ICD-10-CM

## 2018-09-15 DIAGNOSIS — Z124 Encounter for screening for malignant neoplasm of cervix: Secondary | ICD-10-CM

## 2018-09-15 DIAGNOSIS — N951 Menopausal and female climacteric states: Secondary | ICD-10-CM

## 2018-09-15 NOTE — Progress Notes (Signed)
PT is present today for her annual exam. Pt stated that she do monthly breast exams.  Pt stated that she is doing well and denies any issues. No problems or concerns.

## 2018-09-15 NOTE — Progress Notes (Signed)
GYNECOLOGY ANNUAL PHYSICAL EXAM PROGRESS NOTE  Subjective:    ANALIZ TVEDT is a 52 y.o. G0P0 female who presents for an annual exam.  The patient is sexually active (same female partner x 5 years).The patient wears seatbelts: yes. The patient participates in regular exercise: yes (occasionally). Has the patient ever been transfused or tattooed?: no. The patient reports that there is not domestic violence in her life.    The patient has the following complaints today:  1. None  Gynecologic History Menarche age: 15 No LMP recorded. (Menstrual status: Other). Contraception: condoms and tubal ligation History of STI's: Trichomoniasis 2016, treated Last Pap: 05/02/2015. Results were: normal.  Denies h/o abnormal pap smears. Last mammogram: 2013. Results were normal.   Last Colonoscopy: 2017.  Had benign polyps.  Due for f/u in 5 years.  PCP: [facility]) - Dr. Iona Beard   OB History  Gravida Para Term Preterm AB Living  0 0 0 0 0 0  SAB TAB Ectopic Multiple Live Births  0 0 0 0 0    Past Medical History:  Diagnosis Date  . Asthma    just uses inhaler when needed  . Hyperlipemia   . Hypertension     Past Surgical History:  Procedure Laterality Date  . LAPAROSCOPIC TUBAL LIGATION Bilateral 07/14/2013   Procedure: LAPAROSCOPIC TUBAL LIGATION;  Surgeon: Marvene Staff, MD;  Location: [place] [facility];  Service: Gynecology;  Laterality: Bilateral;    Family History  Problem Relation Age of Onset  . Hypertension Mother   . Gout Mother   . Heart attack Mother   . Heart attack Father   . Gout Father   . Hypertension Father   . Diabetes Sister     Social History   Socioeconomic History  . Marital status: Single    Spouse name: Not on file  . Number of children: Not on file  . Years of education: Not on file  . Highest education level: Not on file  Occupational History  . Not on file  Social Needs  . Financial resource strain: Not on file  . Food  insecurity:    Worry: Not on file    Inability: Not on file  . Transportation needs:    Medical: Not on file    Non-medical: Not on file  Tobacco Use  . Smoking status: Never Smoker  . Smokeless tobacco: Never Used  Substance and Sexual Activity  . Alcohol use: No  . Drug use: No  . Sexual activity: Yes    Birth control/protection: Surgical  Lifestyle  . Physical activity:    Days per week: Not on file    Minutes per session: Not on file  . Stress: Not on file  Relationships  . Social connections:    Talks on phone: Not on file    Gets together: Not on file    Attends religious service: Not on file    Active member of club or organization: Not on file    Attends meetings of clubs or organizations: Not on file    Relationship status: Not on file  . Intimate partner violence:    Fear of current or ex partner: Not on file    Emotionally abused: Not on file    Physically abused: Not on file    Forced sexual activity: Not on file  Other Topics Concern  . Not on file  Social History Narrative  . Not on file    Current Outpatient  Medications on File Prior to Visit  Medication Sig Dispense Refill  . amLODipine (NORVASC) 5 MG tablet Take 5 mg by mouth daily.    . hydrocortisone 2.5 % cream   11  . ketoconazole (NIZORAL) 2 % cream   11  . ketoconazole (NIZORAL) 2 % shampoo   11  . lisinopril-hydrochlorothiazide (PRINZIDE,ZESTORETIC) 20-12.5 MG per tablet Take 1 tablet by mouth 2 (two) times daily.    . mupirocin ointment (BACTROBAN) 2 %   2  . norethindrone (AYGESTIN) 5 MG tablet Take 1 tablet (5 mg total) by mouth daily. 30 tablet 11  . omeprazole (PRILOSEC) 20 MG capsule   1  . simvastatin (ZOCOR) 40 MG tablet Take 40 mg by mouth at bedtime.     No current facility-administered medications on file prior to visit.     No Known Allergies   Review of Systems Constitutional: negative for chills, fatigue, fevers and sweats Eyes: negative for irritation, redness and visual  disturbance Ears, nose, mouth, throat, and face: negative for hearing loss, nasal congestion, snoring and tinnitus Respiratory: negative for asthma, cough, sputum Cardiovascular: negative for chest pain, dyspnea, exertional chest pressure/discomfort, irregular heart beat, palpitations and syncope Gastrointestinal: negative for abdominal pain, change in bowel habits, nausea and vomiting Genitourinary:  Negative for abnormal menstrual periods, genital lesions, sexual problems and vaginal discharge, dysuria and urinary incontinence Integument/breast: negative for breast lump, breast tenderness and nipple discharge Hematologic/lymphatic: negative for bleeding and easy bruising Musculoskeletal:negative for back pain and muscle weakness Neurological: negative for dizziness, headaches, vertigo and weakness Endocrine: negative for diabetic symptoms including polydipsia, polyuria and skin dryness Allergic/Immunologic: negative for hay fever and urticaria     Objective:  Blood pressure (!) 169/80, pulse 82, height 5' (1.524 m), weight 281 lb 1.6 oz (127.5 kg). Body mass index is 54.9 kg/m.   General Appearance:    Alert, cooperative, no distress, appears stated age, morbidly obese  Head:    Normocephalic, without obvious abnormality, atraumatic  Eyes:    PERRL, conjunctiva/corneas clear, EOM's intact, both eyes  Ears:    Normal external ear canals, both ears  Nose:   Nares normal, septum midline, mucosa normal, no drainage or sinus tenderness  Throat:   Lips, mucosa, and tongue normal; teeth and gums normal  Neck:   Supple, symmetrical, trachea midline, no adenopathy; thyroid: no enlargement/tenderness/nodules; no carotid bruit or JVD  Back:     Symmetric, no curvature, ROM normal, no CVA tenderness  Lungs:     Clear to auscultation bilaterally, respirations unlabored  Chest Wall:    No tenderness or deformity   Heart:    Regular rate and rhythm, S1 and S2 normal, no murmur, rub or gallop  Breast  Exam:    No tenderness, masses, or nipple abnormality  Abdomen:     Soft, non-tender, bowel sounds active all four quadrants, no masses, no organomegaly.    Genitalia:    Pelvic:external genitalia normal, vagina without lesions, discharge, or tenderness, rectovaginal septum  normal. Cervix normal in appearance, no cervical motion tenderness, no adnexal masses or tenderness.  Uterus normal size, shape, mobile, regular contours, nontender.  Rectal:    Normal external sphincter.  No hemorrhoids appreciated. Internal exam not done.   Extremities:   Extremities normal, atraumatic, no cyanosis or edema  Pulses:   2+ and symmetric all extremities  Skin:   Skin color, texture, turgor normal, no rashes or lesions  Lymph nodes:   Cervical, supraclavicular, and axillary nodes normal  Neurologic:  CNII-XII intact, normal strength, sensation and reflexes throughout    Labs:  Lab Results  Component Value Date   WBC 12.0 (H) 09/14/2017   HGB 11.5 09/14/2017   HCT 36.9 09/14/2017   MCV 80 09/14/2017   PLT 388 (H) 09/14/2017    Lab Results  Component Value Date   CREATININE 0.93 09/14/2017   BUN 10 09/14/2017   NA 142 09/14/2017   K 4.4 09/14/2017   CL 99 09/14/2017   CO2 26 09/14/2017    Lab Results  Component Value Date   ALT 13 09/14/2017   AST 11 09/14/2017   ALKPHOS 89 09/14/2017   BILITOT 0.2 09/14/2017    Lab Results  Component Value Date   TSH 1.350 09/14/2017     Assessment:   Routine gynecologic exam Perimenopausal  Morbidly obese   HTN  Plan:   Labs ordered: Reports having labs ordered by PCP ~ 2 weeks ago.   Breast self exam technique reviewed and patient encouraged to perform self-exam monthly. Contraception: condoms and tubal ligation Discussed healthy lifestyle modifications. Pap smear performed today. Continue routine screening.   Mammogram ordered. Strongly encouraged patient for screening.  Colonoscopy up to date.  Flu vaccine up to date (received in  October at [facility]).  Perimenopausal bleeding controlled with Aygestin 5 mg PO daily.  Patient has been on this since March with good results. Desires to take a break to see if she still needs it. Advised that it was ok to do so.  HTN, mildly elevated today. Did not take her Lisinopril this morning as she was rushing for her appointment today. Is usually managed by PCP.  RTC in 1 year for annual exam.     Rubie Maid, MD [facility]

## 2018-09-15 NOTE — Patient Instructions (Signed)

## 2018-09-20 LAB — CYTOLOGY - PAP
Adequacy: ABSENT — AB
Diagnosis: UNDETERMINED — AB
HPV (WINDOPATH): NOT DETECTED

## 2018-12-06 DIAGNOSIS — M25571 Pain in right ankle and joints of right foot: Secondary | ICD-10-CM | POA: Diagnosis not present

## 2018-12-06 DIAGNOSIS — Z6841 Body Mass Index (BMI) 40.0 and over, adult: Secondary | ICD-10-CM | POA: Diagnosis not present

## 2018-12-06 DIAGNOSIS — I10 Essential (primary) hypertension: Secondary | ICD-10-CM | POA: Diagnosis not present

## 2018-12-14 DIAGNOSIS — R799 Abnormal finding of blood chemistry, unspecified: Secondary | ICD-10-CM | POA: Diagnosis not present

## 2018-12-14 DIAGNOSIS — M1000 Idiopathic gout, unspecified site: Secondary | ICD-10-CM | POA: Diagnosis not present

## 2018-12-14 DIAGNOSIS — R7309 Other abnormal glucose: Secondary | ICD-10-CM | POA: Diagnosis not present

## 2019-02-01 DIAGNOSIS — Z7189 Other specified counseling: Secondary | ICD-10-CM | POA: Diagnosis not present

## 2019-02-01 DIAGNOSIS — I10 Essential (primary) hypertension: Secondary | ICD-10-CM | POA: Diagnosis not present

## 2019-04-12 DIAGNOSIS — I10 Essential (primary) hypertension: Secondary | ICD-10-CM | POA: Diagnosis not present

## 2019-04-12 DIAGNOSIS — E785 Hyperlipidemia, unspecified: Secondary | ICD-10-CM | POA: Diagnosis not present

## 2019-06-27 DIAGNOSIS — E785 Hyperlipidemia, unspecified: Secondary | ICD-10-CM | POA: Diagnosis not present

## 2019-06-27 DIAGNOSIS — Z0001 Encounter for general adult medical examination with abnormal findings: Secondary | ICD-10-CM | POA: Diagnosis not present

## 2019-06-27 DIAGNOSIS — I10 Essential (primary) hypertension: Secondary | ICD-10-CM | POA: Diagnosis not present

## 2019-06-27 DIAGNOSIS — N390 Urinary tract infection, site not specified: Secondary | ICD-10-CM | POA: Diagnosis not present

## 2019-09-19 ENCOUNTER — Encounter: Payer: 59 | Admitting: Obstetrics and Gynecology

## 2019-10-31 DIAGNOSIS — E785 Hyperlipidemia, unspecified: Secondary | ICD-10-CM | POA: Diagnosis not present

## 2019-10-31 DIAGNOSIS — I10 Essential (primary) hypertension: Secondary | ICD-10-CM | POA: Diagnosis not present

## 2019-11-18 ENCOUNTER — Telehealth: Payer: Self-pay | Admitting: *Deleted

## 2019-11-18 NOTE — Telephone Encounter (Signed)
Has appt for 2nd dose vaccine 2/11 early AM, works at [employer] and has been moved to a different shift and wants to reschedule to later in afternoon. Would like to be called back at [phone]. Will pass to dayshift scheduler.

## 2020-02-28 DIAGNOSIS — I10 Essential (primary) hypertension: Secondary | ICD-10-CM | POA: Diagnosis not present

## 2020-02-28 DIAGNOSIS — Z7189 Other specified counseling: Secondary | ICD-10-CM | POA: Diagnosis not present

## 2020-02-28 DIAGNOSIS — E785 Hyperlipidemia, unspecified: Secondary | ICD-10-CM | POA: Diagnosis not present

## 2020-06-05 ENCOUNTER — Other Ambulatory Visit: Payer: Self-pay | Admitting: Family Medicine

## 2020-06-05 DIAGNOSIS — E785 Hyperlipidemia, unspecified: Secondary | ICD-10-CM | POA: Diagnosis not present

## 2020-06-05 DIAGNOSIS — I10 Essential (primary) hypertension: Secondary | ICD-10-CM | POA: Diagnosis not present

## 2020-06-05 DIAGNOSIS — Z0000 Encounter for general adult medical examination without abnormal findings: Secondary | ICD-10-CM | POA: Diagnosis not present

## 2020-10-23 DIAGNOSIS — G4733 Obstructive sleep apnea (adult) (pediatric): Secondary | ICD-10-CM | POA: Diagnosis not present

## 2020-10-23 DIAGNOSIS — C4330 Malignant melanoma of unspecified part of face: Secondary | ICD-10-CM | POA: Diagnosis not present

## 2020-10-23 DIAGNOSIS — I10 Essential (primary) hypertension: Secondary | ICD-10-CM | POA: Diagnosis not present

## 2020-10-23 DIAGNOSIS — E785 Hyperlipidemia, unspecified: Secondary | ICD-10-CM | POA: Diagnosis not present

## 2020-10-23 DIAGNOSIS — Z6841 Body Mass Index (BMI) 40.0 and over, adult: Secondary | ICD-10-CM | POA: Diagnosis not present

## 2021-02-25 DIAGNOSIS — G4733 Obstructive sleep apnea (adult) (pediatric): Secondary | ICD-10-CM | POA: Diagnosis not present

## 2021-02-25 DIAGNOSIS — E663 Overweight: Secondary | ICD-10-CM | POA: Diagnosis not present

## 2021-02-25 DIAGNOSIS — E785 Hyperlipidemia, unspecified: Secondary | ICD-10-CM | POA: Diagnosis not present

## 2021-02-25 DIAGNOSIS — I10 Essential (primary) hypertension: Secondary | ICD-10-CM | POA: Diagnosis not present

## 2021-02-25 DIAGNOSIS — M170 Bilateral primary osteoarthritis of knee: Secondary | ICD-10-CM | POA: Diagnosis not present

## 2021-02-26 ENCOUNTER — Other Ambulatory Visit: Payer: Self-pay

## 2021-02-26 MED ORDER — AMLODIPINE BESYLATE 10 MG PO TABS
ORAL_TABLET | ORAL | 1 refills | Status: DC
  Filled 2021-02-26: qty 90, 90d supply, fill #0
  Filled 2021-07-07 – 2021-07-08 (×2): qty 90, 90d supply, fill #1

## 2021-02-26 MED ORDER — SIMVASTATIN 20 MG PO TABS
ORAL_TABLET | ORAL | 1 refills | Status: DC
  Filled 2021-02-26: qty 90, 90d supply, fill #0

## 2021-02-26 MED ORDER — SIMVASTATIN 40 MG PO TABS
ORAL_TABLET | ORAL | 1 refills | Status: AC
  Filled 2021-02-26 (×2): qty 90, 90d supply, fill #0
  Filled 2021-07-07 – 2021-07-08 (×2): qty 90, 90d supply, fill #1

## 2021-02-26 MED ORDER — LISINOPRIL-HYDROCHLOROTHIAZIDE 20-25 MG PO TABS
ORAL_TABLET | ORAL | 1 refills | Status: DC
  Filled 2021-02-26: qty 90, 90d supply, fill #0
  Filled 2021-07-07 – 2021-07-08 (×2): qty 90, 90d supply, fill #1

## 2021-05-28 DIAGNOSIS — L732 Hidradenitis suppurativa: Secondary | ICD-10-CM | POA: Diagnosis not present

## 2021-05-28 DIAGNOSIS — M170 Bilateral primary osteoarthritis of knee: Secondary | ICD-10-CM | POA: Diagnosis not present

## 2021-05-28 DIAGNOSIS — E785 Hyperlipidemia, unspecified: Secondary | ICD-10-CM | POA: Diagnosis not present

## 2021-05-28 DIAGNOSIS — Z6841 Body Mass Index (BMI) 40.0 and over, adult: Secondary | ICD-10-CM | POA: Diagnosis not present

## 2021-05-28 DIAGNOSIS — G4733 Obstructive sleep apnea (adult) (pediatric): Secondary | ICD-10-CM | POA: Diagnosis not present

## 2021-05-28 DIAGNOSIS — I10 Essential (primary) hypertension: Secondary | ICD-10-CM | POA: Diagnosis not present

## 2021-07-08 ENCOUNTER — Other Ambulatory Visit: Payer: Self-pay

## 2021-07-09 ENCOUNTER — Other Ambulatory Visit: Payer: Self-pay

## 2021-07-09 MED ORDER — ZOSTER VAC RECOMB ADJUVANTED 50 MCG/0.5ML IM SUSR
0.5000 mL | Freq: Once | INTRAMUSCULAR | 1 refills | Status: AC
  Filled 2021-07-09 – 2022-06-20 (×3): qty 0.5, 1d supply, fill #0

## 2021-07-16 ENCOUNTER — Ambulatory Visit: Payer: 59 | Attending: Internal Medicine

## 2021-07-16 ENCOUNTER — Other Ambulatory Visit: Payer: Self-pay

## 2021-07-16 DIAGNOSIS — Z23 Encounter for immunization: Secondary | ICD-10-CM

## 2021-07-16 MED ORDER — PFIZER COVID-19 VAC BIVALENT 30 MCG/0.3ML IM SUSP
INTRAMUSCULAR | 0 refills | Status: AC
  Filled 2021-07-16: qty 0.3, 30d supply, fill #0

## 2021-07-16 NOTE — Progress Notes (Signed)
   Covid-19 Vaccination Clinic  Name:  Colleen Gibbs    MRN: [id number] DOB: 06/08/1966  07/16/2021  [name] was observed post Covid-19 immunization for 15 minutes without incident. She was provided with Vaccine Information Sheet and instruction to access the V-Safe system.   [name] was instructed to call 911 with any severe reactions post vaccine: Difficulty breathing  Swelling of face and throat  A fast heartbeat  A bad rash all over body  Dizziness and weakness   Lu Duffel, PharmD, MBA Clinical Acute Care Pharmacist

## 2021-08-06 ENCOUNTER — Other Ambulatory Visit: Payer: Self-pay

## 2021-08-07 ENCOUNTER — Other Ambulatory Visit: Payer: Self-pay

## 2021-08-07 MED ORDER — INDOMETHACIN 50 MG PO CAPS
ORAL_CAPSULE | ORAL | 1 refills | Status: AC
  Filled 2021-08-07: qty 30, 10d supply, fill #0
  Filled 2021-09-30: qty 30, 10d supply, fill #1

## 2021-08-08 ENCOUNTER — Other Ambulatory Visit: Payer: Self-pay

## 2021-09-30 ENCOUNTER — Other Ambulatory Visit: Payer: Self-pay

## 2021-09-30 DIAGNOSIS — M170 Bilateral primary osteoarthritis of knee: Secondary | ICD-10-CM | POA: Diagnosis not present

## 2021-09-30 DIAGNOSIS — E785 Hyperlipidemia, unspecified: Secondary | ICD-10-CM | POA: Diagnosis not present

## 2021-09-30 DIAGNOSIS — I10 Essential (primary) hypertension: Secondary | ICD-10-CM | POA: Diagnosis not present

## 2021-09-30 MED ORDER — AMLODIPINE BESYLATE 10 MG PO TABS
ORAL_TABLET | ORAL | 1 refills | Status: AC
  Filled 2021-09-30: qty 90, 90d supply, fill #0
  Filled 2022-01-10: qty 90, 90d supply, fill #1

## 2021-09-30 MED ORDER — LISINOPRIL-HYDROCHLOROTHIAZIDE 20-25 MG PO TABS
ORAL_TABLET | ORAL | 1 refills | Status: AC
  Filled 2021-09-30: qty 90, 90d supply, fill #0
  Filled 2022-01-10: qty 90, 90d supply, fill #1

## 2021-09-30 MED ORDER — SIMVASTATIN 20 MG PO TABS
ORAL_TABLET | ORAL | 1 refills | Status: AC
  Filled 2021-09-30: qty 90, 90d supply, fill #0
  Filled 2022-01-10: qty 90, 90d supply, fill #1

## 2021-10-14 ENCOUNTER — Other Ambulatory Visit: Payer: Self-pay

## 2022-01-10 ENCOUNTER — Other Ambulatory Visit: Payer: Self-pay

## 2022-01-27 DIAGNOSIS — I10 Essential (primary) hypertension: Secondary | ICD-10-CM | POA: Diagnosis not present

## 2022-01-27 DIAGNOSIS — E785 Hyperlipidemia, unspecified: Secondary | ICD-10-CM | POA: Diagnosis not present

## 2022-01-27 DIAGNOSIS — M170 Bilateral primary osteoarthritis of knee: Secondary | ICD-10-CM | POA: Diagnosis not present

## 2022-05-06 ENCOUNTER — Other Ambulatory Visit: Payer: Self-pay

## 2022-05-06 DIAGNOSIS — M47816 Spondylosis without myelopathy or radiculopathy, lumbar region: Secondary | ICD-10-CM | POA: Diagnosis not present

## 2022-05-06 DIAGNOSIS — M545 Low back pain, unspecified: Secondary | ICD-10-CM | POA: Diagnosis not present

## 2022-05-06 DIAGNOSIS — M4316 Spondylolisthesis, lumbar region: Secondary | ICD-10-CM | POA: Diagnosis not present

## 2022-05-06 DIAGNOSIS — M47896 Other spondylosis, lumbar region: Secondary | ICD-10-CM | POA: Diagnosis not present

## 2022-05-06 MED ORDER — CYCLOBENZAPRINE HCL 5 MG PO TABS
ORAL_TABLET | ORAL | 0 refills | Status: AC
  Filled 2022-05-06: qty 15, 5d supply, fill #0

## 2022-05-06 MED ORDER — METHYLPREDNISOLONE 4 MG PO TBPK
ORAL_TABLET | ORAL | 0 refills | Status: AC
  Filled 2022-05-06: qty 21, 6d supply, fill #0

## 2022-06-02 DIAGNOSIS — I10 Essential (primary) hypertension: Secondary | ICD-10-CM | POA: Diagnosis not present

## 2022-06-02 DIAGNOSIS — E785 Hyperlipidemia, unspecified: Secondary | ICD-10-CM | POA: Diagnosis not present

## 2022-06-09 DIAGNOSIS — G4733 Obstructive sleep apnea (adult) (pediatric): Secondary | ICD-10-CM | POA: Diagnosis not present

## 2022-06-09 DIAGNOSIS — Z0001 Encounter for general adult medical examination with abnormal findings: Secondary | ICD-10-CM | POA: Diagnosis not present

## 2022-06-09 DIAGNOSIS — M159 Polyosteoarthritis, unspecified: Secondary | ICD-10-CM | POA: Diagnosis not present

## 2022-06-09 DIAGNOSIS — I10 Essential (primary) hypertension: Secondary | ICD-10-CM | POA: Diagnosis not present

## 2022-06-09 DIAGNOSIS — M150 Primary generalized (osteo)arthritis: Secondary | ICD-10-CM | POA: Diagnosis not present

## 2022-06-09 DIAGNOSIS — E785 Hyperlipidemia, unspecified: Secondary | ICD-10-CM | POA: Diagnosis not present

## 2022-06-09 DIAGNOSIS — R0683 Snoring: Secondary | ICD-10-CM | POA: Diagnosis not present

## 2022-06-09 DIAGNOSIS — Z6841 Body Mass Index (BMI) 40.0 and over, adult: Secondary | ICD-10-CM | POA: Diagnosis not present

## 2022-06-19 ENCOUNTER — Other Ambulatory Visit: Payer: Self-pay

## 2022-06-19 MED ORDER — AMLODIPINE BESYLATE 10 MG PO TABS
10.0000 mg | ORAL_TABLET | Freq: Every day | ORAL | 1 refills | Status: DC
  Filled 2022-06-19: qty 90, 90d supply, fill #0
  Filled 2022-10-29: qty 90, 90d supply, fill #1

## 2022-06-19 MED ORDER — LISINOPRIL-HYDROCHLOROTHIAZIDE 20-25 MG PO TABS
1.0000 | ORAL_TABLET | Freq: Every day | ORAL | 1 refills | Status: DC
  Filled 2022-06-19: qty 90, 90d supply, fill #0
  Filled 2022-10-29: qty 90, 90d supply, fill #1

## 2022-06-19 MED ORDER — SIMVASTATIN 20 MG PO TABS
20.0000 mg | ORAL_TABLET | Freq: Every day | ORAL | 1 refills | Status: DC
  Filled 2022-06-19: qty 90, 90d supply, fill #0
  Filled 2022-10-29: qty 90, 90d supply, fill #1

## 2022-06-20 ENCOUNTER — Other Ambulatory Visit: Payer: Self-pay

## 2022-08-29 ENCOUNTER — Other Ambulatory Visit: Payer: Self-pay

## 2022-08-29 MED ORDER — ZOSTER VAC RECOMB ADJUVANTED 50 MCG/0.5ML IM SUSR
0.5000 mL | Freq: Once | INTRAMUSCULAR | 0 refills | Status: AC
  Filled 2022-08-29: qty 0.5, 1d supply, fill #0

## 2022-09-09 DIAGNOSIS — H0288B Meibomian gland dysfunction left eye, upper and lower eyelids: Secondary | ICD-10-CM | POA: Diagnosis not present

## 2022-12-22 DIAGNOSIS — I10 Essential (primary) hypertension: Secondary | ICD-10-CM | POA: Diagnosis not present

## 2022-12-22 DIAGNOSIS — Z6841 Body Mass Index (BMI) 40.0 and over, adult: Secondary | ICD-10-CM | POA: Diagnosis not present

## 2022-12-22 DIAGNOSIS — E7800 Pure hypercholesterolemia, unspecified: Secondary | ICD-10-CM | POA: Diagnosis not present

## 2022-12-23 ENCOUNTER — Other Ambulatory Visit: Payer: Self-pay

## 2022-12-24 ENCOUNTER — Other Ambulatory Visit: Payer: Self-pay

## 2022-12-24 MED ORDER — LISINOPRIL-HYDROCHLOROTHIAZIDE 20-25 MG PO TABS
1.0000 | ORAL_TABLET | Freq: Every day | ORAL | 1 refills | Status: AC
  Filled 2022-12-24 – 2023-03-19 (×2): qty 90, 90d supply, fill #0
  Filled 2023-08-19: qty 90, 90d supply, fill #1

## 2022-12-24 MED ORDER — AMLODIPINE BESYLATE 10 MG PO TABS
10.0000 mg | ORAL_TABLET | Freq: Every day | ORAL | 1 refills | Status: AC
  Filled 2022-12-24 – 2023-03-19 (×2): qty 90, 90d supply, fill #0
  Filled 2023-08-19: qty 90, 90d supply, fill #1

## 2022-12-24 MED ORDER — SIMVASTATIN 20 MG PO TABS
20.0000 mg | ORAL_TABLET | Freq: Every day | ORAL | 1 refills | Status: AC
  Filled 2022-12-24 – 2023-03-19 (×2): qty 90, 90d supply, fill #0
  Filled 2023-08-19: qty 90, 90d supply, fill #1

## 2022-12-29 ENCOUNTER — Other Ambulatory Visit: Payer: Self-pay

## 2023-01-21 ENCOUNTER — Other Ambulatory Visit: Payer: Self-pay

## 2023-01-21 MED ORDER — CLOTRIMAZOLE-BETAMETHASONE 1-0.05 % EX CREA
1.0000 | TOPICAL_CREAM | Freq: Two times a day (BID) | CUTANEOUS | 1 refills | Status: AC
  Filled 2023-01-21: qty 45, 30d supply, fill #0
  Filled 2023-04-21: qty 45, 30d supply, fill #1

## 2023-03-07 ENCOUNTER — Ambulatory Visit
Admission: EM | Admit: 2023-03-07 | Discharge: 2023-03-07 | Disposition: A | Discharge status: Home / Self Care | Payer: Commercial Managed Care - PPO | Attending: Urgent Care | Admitting: Urgent Care

## 2023-03-07 DIAGNOSIS — H9202 Otalgia, left ear: Secondary | ICD-10-CM | POA: Diagnosis not present

## 2023-03-07 MED ORDER — CIPROFLOXACIN-DEXAMETHASONE 0.3-0.1 % OT SUSP: 4.0000 [drp] | Freq: Two times a day (BID) | OTIC | 0 refills | Status: AC

## 2023-03-07 NOTE — ED Provider Notes (Signed)
Renaldo Fiddler    CSN: [id number] Arrival date & time: 03/07/23  0802      History   Chief Complaint No chief complaint on file.   HPI CARNEISHA AKANDE is a 57 y.o. female.   HPI  Patient presents to urgent care with complaint of left otalgia and "feels clogged" x 2 days. Treating with OTC ear drops and tylenol. Denies fever. Denies drainage.  Patient found with elevated BP during triage. She states she has not taken her BP meds.  Past Medical History:  Diagnosis Date   Asthma    just uses inhaler when needed   Hyperlipemia    Hypertension     There are no problems to display for this patient.   Past Surgical History:  Procedure Laterality Date   LAPAROSCOPIC TUBAL LIGATION Bilateral 07/14/2013   Procedure: LAPAROSCOPIC TUBAL LIGATION;  Surgeon: Serita Kyle, MD;  Location: [facility];  Service: Gynecology;  Laterality: Bilateral;    OB History     Gravida  0   Para  0   Term  0   Preterm  0   AB  0   Living  0      SAB  0   IAB  0   Ectopic  0   Multiple  0   Live Births               Home Medications    Prior to Admission medications   Medication Sig Start Date End Date Taking? Authorizing Provider  amLODipine (NORVASC) 10 MG tablet TAKE 1 TABLET BY MOUTH DAILY. 06/05/20 06/05/21  Mirna Mires, MD  amLODipine (NORVASC) 10 MG tablet Take one tablet by mouth daily 09/30/21     amLODipine (NORVASC) 10 MG tablet Take 1 tablet (10 mg total) by mouth daily. 12/24/22     amLODipine (NORVASC) 5 MG tablet Take 5 mg by mouth daily.    [provider]  clotrimazole-betamethasone (LOTRISONE) cream Apply 1 Application topically to rash under breast 2 (two) times daily morning and bedtime. 01/21/23     COVID-19 mRNA bivalent vaccine, Pfizer, (PFIZER COVID-19 VAC BIVALENT) injection Inject into the muscle. 07/16/21   Judyann Munson, MD  cyclobenzaprine (FLEXERIL) 5 MG tablet Take ONE tab PO BID/TID PRN 05/06/22     hydrocortisone 2.5  % cream  07/03/17   [provider]  indomethacin (INDOCIN) 50 MG capsule Take 1 tablet by mouth three times day 08/07/21     ketoconazole (NIZORAL) 2 % cream  07/03/17   [provider]  ketoconazole (NIZORAL) 2 % shampoo  08/24/17   [provider]  lisinopril-hydrochlorothiazide (PRINZIDE,ZESTORETIC) 20-12.5 MG per tablet Take 1 tablet by mouth 2 (two) times daily.    [provider]  lisinopril-hydrochlorothiazide (ZESTORETIC) 20-25 MG tablet TAKE 1 TABLET BY MOUTH DAILY. 06/05/20 06/05/21  Mirna Mires, MD  lisinopril-hydrochlorothiazide (ZESTORETIC) 20-25 MG tablet Take one tablet by mouth daily 09/30/21     lisinopril-hydrochlorothiazide (ZESTORETIC) 20-25 MG tablet Take 1 tablet by mouth daily. 12/24/22     methylPREDNISolone (MEDROL) 4 MG TBPK tablet Take as instructed for 6 days 05/06/22     mupirocin ointment (BACTROBAN) 2 %  08/24/17   [provider]  norethindrone (AYGESTIN) 5 MG tablet Take 1 tablet (5 mg total) by mouth daily. 12/15/17   Hildred Laser, MD  omeprazole (PRILOSEC) 20 MG capsule  07/06/17   [provider]  simvastatin (ZOCOR) 20 MG tablet Take one tablet by mouth at bedtime  daily 09/30/21     simvastatin (ZOCOR) 20 MG tablet Take 1 tablet (20 mg total) by mouth at bedtime. 12/24/22     simvastatin (ZOCOR) 40 MG tablet Take 40 mg by mouth at bedtime.    [provider]  simvastatin (ZOCOR) 40 MG tablet TAKE 1 TABLET BY MOUTH ONCE DAILY AT BEDTIME 06/05/20 06/05/21  Mirna Mires, MD  simvastatin (ZOCOR) 40 MG tablet Take 1 tablet by mouth at bedtime 02/25/21       Family History Family History  Problem Relation Age of Onset   Hypertension Mother    Gout Mother    Heart attack Mother    Heart attack Father    Gout Father    Hypertension Father    Diabetes Sister     Social History Social History   Tobacco Use   Smoking status: Never   Smokeless tobacco: Never  Vaping Use   Vaping Use: Never used   Substance Use Topics   Alcohol use: No   Drug use: No     Allergies   Patient has no known allergies.   Review of Systems Review of Systems   Physical Exam Triage Vital Signs ED Triage Vitals  Enc Vitals Group     BP      Pulse      Resp      Temp      Temp src      SpO2      Weight      Height      Head Circumference      Peak Flow      Pain Score      Pain Loc      Pain Edu?      Excl. in GC?    No data found.  Updated Vital Signs There were no vitals taken for this visit.  Visual Acuity Right Eye Distance:   Left Eye Distance:   Bilateral Distance:    Right Eye Near:   Left Eye Near:    Bilateral Near:     Physical Exam Vitals reviewed.  Constitutional:      Appearance: Normal appearance.  HENT:     Ears:     Comments: Unable to focus on the patient's TM using the otoscope.  Skin:    General: Skin is warm and dry.  Neurological:     General: No focal deficit present.     Mental Status: She is alert and oriented to person, place, and time.  Psychiatric:        Mood and Affect: Mood normal.      UC Treatments / Results  Labs (all labs ordered are listed, but only abnormal results are displayed) Labs Reviewed - No data to display  EKG   Radiology No results found.  Procedures Procedures (including critical care time)  Medications Ordered in UC Medications - No data to display  Initial Impression / Assessment and Plan / UC Course  I have reviewed the triage vital signs and the nursing notes.  Pertinent labs & imaging results that were available during my care of the patient were reviewed by me and considered in my medical decision making (see chart for details).  Clinical Course as of 03/07/23 [id number]  Sat Mar 07, 2023  0837 BPMarland Kitchen)(S): 174/83 [SI]    Clinical Course User Index [SI] Omeed Osuna, Jeannett Senior, FNP   LATECIA IPPOLITO is a 57 y.o. female presenting with L otalgia and acute hearing loss. Patient is afebrile without  recent  antipyretics, satting well on room air. Overall is well appearing though non-toxic, well hydrated, without respiratory distress.  Right TM and EAC are WNL.  Left TM is obstructed as I am unable to focus on the patient's TM using the otoscope d/t either inflammation or obstruction. She does not complain of tenderness while using the otoscope but does have some periauricular tenderness with palpation, especially around the parotid glands.  I have instilled debrox solution into the L EAC and attempted to gently irrigate the ear canal which the patient did not tolerate. Examined the ear using magnifying visor and light and determined that the external meatus of the EAC has significant swelling.  Given the periauricular tenderness, I suspect L otitis externa and will treat with ciprodex. Patient is counseled to return for re-evaluation or seek evaluation at ENT or ED if symptoms do not improve or if they recur.  Patient was found to have elevated BP during triage. She was encouraged to take her BP meds as soon as possible and to f/u with her PCP regarding possible need for dose adjustment.  Reviewed relevant chart history.   Counseled patient on potential for adverse effects with medications prescribed/recommended today, ER and return-to-clinic precautions discussed, patient verbalized understanding and agreement with care plan.    Final Clinical Impressions(s) / UC Diagnoses   Final diagnoses:  None   Discharge Instructions   None    ED Prescriptions   None    PDMP not reviewed this encounter.   Charma Igo, [place] 03/07/23 [phone]

## 2023-03-07 NOTE — Discharge Instructions (Signed)
I have prescribed an antibiotic eardrop with an anti-inflammatory agent to treat a presumed infection of your left external ear.  There is significant swelling in your ear canal which may prevent the medicine from being introduced into the canal.  If your symptoms do not resolve, or if they recur, please seek evaluation by an ear specialist.  Either your PCP can refer you or you can go to the ER.

## 2023-03-07 NOTE — ED Triage Notes (Signed)
Patient presents to UC for left ear pain x 2 days, treating with PM ear drops and tylenol.   Denies fever or drainage.

## 2023-03-19 ENCOUNTER — Other Ambulatory Visit: Payer: Self-pay

## 2023-03-20 ENCOUNTER — Other Ambulatory Visit: Payer: Self-pay

## 2023-04-21 ENCOUNTER — Other Ambulatory Visit: Payer: Self-pay

## 2023-04-21 DIAGNOSIS — I10 Essential (primary) hypertension: Secondary | ICD-10-CM | POA: Diagnosis not present

## 2023-04-21 DIAGNOSIS — Z6841 Body Mass Index (BMI) 40.0 and over, adult: Secondary | ICD-10-CM | POA: Diagnosis not present

## 2023-04-21 DIAGNOSIS — E7800 Pure hypercholesterolemia, unspecified: Secondary | ICD-10-CM | POA: Diagnosis not present

## 2023-04-21 DIAGNOSIS — M79672 Pain in left foot: Secondary | ICD-10-CM | POA: Diagnosis not present

## 2023-04-21 DIAGNOSIS — E669 Obesity, unspecified: Secondary | ICD-10-CM | POA: Diagnosis not present

## 2023-04-21 MED ORDER — INDOMETHACIN 50 MG PO CAPS
50.0000 mg | ORAL_CAPSULE | Freq: Three times a day (TID) | ORAL | 0 refills | Status: AC
  Filled 2023-04-21: qty 14, 5d supply, fill #0

## 2023-05-12 ENCOUNTER — Other Ambulatory Visit: Payer: Self-pay

## 2023-05-12 DIAGNOSIS — M10072 Idiopathic gout, left ankle and foot: Secondary | ICD-10-CM | POA: Diagnosis not present

## 2023-05-12 DIAGNOSIS — E669 Obesity, unspecified: Secondary | ICD-10-CM | POA: Diagnosis not present

## 2023-05-12 DIAGNOSIS — I10 Essential (primary) hypertension: Secondary | ICD-10-CM | POA: Diagnosis not present

## 2023-05-12 MED ORDER — ALLOPURINOL 100 MG PO TABS
100.0000 mg | ORAL_TABLET | Freq: Every day | ORAL | 0 refills | Status: AC
  Filled 2023-05-12: qty 90, 90d supply, fill #0

## 2023-07-21 DIAGNOSIS — I10 Essential (primary) hypertension: Secondary | ICD-10-CM | POA: Diagnosis not present

## 2023-07-21 DIAGNOSIS — E663 Overweight: Secondary | ICD-10-CM | POA: Diagnosis not present

## 2023-07-21 DIAGNOSIS — M25561 Pain in right knee: Secondary | ICD-10-CM | POA: Diagnosis not present

## 2023-07-21 DIAGNOSIS — Z0000 Encounter for general adult medical examination without abnormal findings: Secondary | ICD-10-CM | POA: Diagnosis not present

## 2023-07-21 DIAGNOSIS — Z0001 Encounter for general adult medical examination with abnormal findings: Secondary | ICD-10-CM | POA: Diagnosis not present

## 2023-07-21 DIAGNOSIS — M25562 Pain in left knee: Secondary | ICD-10-CM | POA: Diagnosis not present

## 2023-07-27 ENCOUNTER — Other Ambulatory Visit: Payer: Self-pay

## 2023-07-27 MED ORDER — CEPHALEXIN 500 MG PO CAPS
500.0000 mg | ORAL_CAPSULE | Freq: Three times a day (TID) | ORAL | 0 refills | Status: AC
  Filled 2023-07-27: qty 30, 10d supply, fill #0

## 2023-07-30 DIAGNOSIS — Z1211 Encounter for screening for malignant neoplasm of colon: Secondary | ICD-10-CM | POA: Diagnosis not present

## 2023-07-30 DIAGNOSIS — K219 Gastro-esophageal reflux disease without esophagitis: Secondary | ICD-10-CM | POA: Diagnosis not present

## 2023-07-30 DIAGNOSIS — Z860100 Personal history of colon polyps, unspecified: Secondary | ICD-10-CM | POA: Diagnosis not present

## 2023-07-30 DIAGNOSIS — R635 Abnormal weight gain: Secondary | ICD-10-CM | POA: Diagnosis not present

## 2023-07-30 DIAGNOSIS — E782 Mixed hyperlipidemia: Secondary | ICD-10-CM | POA: Diagnosis not present

## 2023-07-30 DIAGNOSIS — I10 Essential (primary) hypertension: Secondary | ICD-10-CM | POA: Diagnosis not present

## 2023-07-30 DIAGNOSIS — M109 Gout, unspecified: Secondary | ICD-10-CM | POA: Diagnosis not present

## 2023-08-12 ENCOUNTER — Other Ambulatory Visit: Payer: Self-pay | Admitting: Gastroenterology

## 2023-08-25 DIAGNOSIS — A499 Bacterial infection, unspecified: Secondary | ICD-10-CM | POA: Diagnosis not present

## 2023-10-19 ENCOUNTER — Other Ambulatory Visit: Payer: Self-pay

## 2023-10-19 MED ORDER — GOLYTELY 236 G PO SOLR
4000.0000 mL | ORAL | 0 refills | Status: AC
  Filled 2023-10-19: qty 4000, 1d supply, fill #0

## 2023-10-21 ENCOUNTER — Encounter (HOSPITAL_COMMUNITY): Payer: Self-pay | Admitting: Gastroenterology

## 2023-10-27 DIAGNOSIS — Z6841 Body Mass Index (BMI) 40.0 and over, adult: Secondary | ICD-10-CM | POA: Diagnosis not present

## 2023-10-27 DIAGNOSIS — E7800 Pure hypercholesterolemia, unspecified: Secondary | ICD-10-CM | POA: Diagnosis not present

## 2023-10-27 DIAGNOSIS — M25561 Pain in right knee: Secondary | ICD-10-CM | POA: Diagnosis not present

## 2023-10-27 DIAGNOSIS — I10 Essential (primary) hypertension: Secondary | ICD-10-CM | POA: Diagnosis not present

## 2023-10-27 DIAGNOSIS — M25562 Pain in left knee: Secondary | ICD-10-CM | POA: Diagnosis not present

## 2023-10-30 ENCOUNTER — Ambulatory Visit (HOSPITAL_COMMUNITY): Admission: RE | Admit: 2023-10-30 | Payer: 59 | Source: Home / Self Care | Admitting: Gastroenterology

## 2023-10-30 SURGERY — COLONOSCOPY WITH PROPOFOL
Anesthesia: Monitor Anesthesia Care

## 2023-12-29 ENCOUNTER — Other Ambulatory Visit: Payer: Self-pay

## 2023-12-29 MED ORDER — SIMVASTATIN 40 MG PO TABS
40.0000 mg | ORAL_TABLET | Freq: Every day | ORAL | 1 refills | Status: AC
  Filled 2023-12-29: qty 90, 90d supply, fill #0
  Filled 2024-04-24: qty 90, 90d supply, fill #1

## 2023-12-29 MED ORDER — AMLODIPINE BESYLATE 10 MG PO TABS
10.0000 mg | ORAL_TABLET | Freq: Every day | ORAL | 1 refills | Status: AC
  Filled 2023-12-29: qty 90, 90d supply, fill #0
  Filled 2024-04-24: qty 90, 90d supply, fill #1

## 2023-12-29 MED ORDER — LISINOPRIL-HYDROCHLOROTHIAZIDE 20-25 MG PO TABS
1.0000 | ORAL_TABLET | Freq: Every day | ORAL | 1 refills | Status: AC
  Filled 2023-12-29: qty 90, 90d supply, fill #0
  Filled 2024-04-24: qty 90, 90d supply, fill #1

## 2024-02-23 ENCOUNTER — Other Ambulatory Visit: Payer: Self-pay

## 2024-02-23 DIAGNOSIS — M25561 Pain in right knee: Secondary | ICD-10-CM | POA: Diagnosis not present

## 2024-02-23 DIAGNOSIS — M25562 Pain in left knee: Secondary | ICD-10-CM | POA: Diagnosis not present

## 2024-02-23 DIAGNOSIS — I10 Essential (primary) hypertension: Secondary | ICD-10-CM | POA: Diagnosis not present

## 2024-02-23 DIAGNOSIS — G4733 Obstructive sleep apnea (adult) (pediatric): Secondary | ICD-10-CM | POA: Diagnosis not present

## 2024-02-23 DIAGNOSIS — E7800 Pure hypercholesterolemia, unspecified: Secondary | ICD-10-CM | POA: Diagnosis not present

## 2024-02-23 MED ORDER — CLOTRIMAZOLE-BETAMETHASONE 1-0.05 % EX CREA
TOPICAL_CREAM | Freq: Two times a day (BID) | CUTANEOUS | 1 refills | Status: AC
  Filled 2024-02-23: qty 45, 30d supply, fill #0

## 2024-02-24 ENCOUNTER — Other Ambulatory Visit: Payer: Self-pay

## 2024-05-30 ENCOUNTER — Other Ambulatory Visit: Payer: Self-pay

## 2024-05-30 DIAGNOSIS — E7800 Pure hypercholesterolemia, unspecified: Secondary | ICD-10-CM | POA: Diagnosis not present

## 2024-05-30 DIAGNOSIS — R809 Proteinuria, unspecified: Secondary | ICD-10-CM | POA: Diagnosis not present

## 2024-05-30 DIAGNOSIS — G4733 Obstructive sleep apnea (adult) (pediatric): Secondary | ICD-10-CM | POA: Diagnosis not present

## 2024-05-30 DIAGNOSIS — Z0001 Encounter for general adult medical examination with abnormal findings: Secondary | ICD-10-CM | POA: Diagnosis not present

## 2024-05-30 DIAGNOSIS — I10 Essential (primary) hypertension: Secondary | ICD-10-CM | POA: Diagnosis not present

## 2024-05-30 DIAGNOSIS — L02221 Furuncle of abdominal wall: Secondary | ICD-10-CM | POA: Diagnosis not present

## 2024-05-30 MED ORDER — CEPHALEXIN 500 MG PO CAPS
500.0000 mg | ORAL_CAPSULE | Freq: Four times a day (QID) | ORAL | 0 refills | Status: AC
  Filled 2024-05-30: qty 28, 7d supply, fill #0

## 2024-06-07 DIAGNOSIS — R809 Proteinuria, unspecified: Secondary | ICD-10-CM | POA: Diagnosis not present

## 2024-06-07 DIAGNOSIS — I10 Essential (primary) hypertension: Secondary | ICD-10-CM | POA: Diagnosis not present

## 2024-06-07 DIAGNOSIS — Z0001 Encounter for general adult medical examination with abnormal findings: Secondary | ICD-10-CM | POA: Diagnosis not present

## 2024-06-07 DIAGNOSIS — L02221 Furuncle of abdominal wall: Secondary | ICD-10-CM | POA: Diagnosis not present

## 2024-06-07 DIAGNOSIS — E7800 Pure hypercholesterolemia, unspecified: Secondary | ICD-10-CM | POA: Diagnosis not present

## 2024-06-07 DIAGNOSIS — G4733 Obstructive sleep apnea (adult) (pediatric): Secondary | ICD-10-CM | POA: Diagnosis not present
